# Patient Record
Sex: Female | Born: 1950 | Race: White | Hispanic: No | Marital: Married | Smoking: Never smoker
Health system: Southern US, Community
[De-identification: ages and names within clinical notes are randomized; demographics above are authoritative.]

## PROBLEM LIST (undated history)

## (undated) DIAGNOSIS — E119 Type 2 diabetes mellitus without complications: Secondary | ICD-10-CM

## (undated) DIAGNOSIS — K219 Gastro-esophageal reflux disease without esophagitis: Secondary | ICD-10-CM

## (undated) DIAGNOSIS — K311 Adult hypertrophic pyloric stenosis: Secondary | ICD-10-CM

## (undated) DIAGNOSIS — E611 Iron deficiency: Secondary | ICD-10-CM

## (undated) DIAGNOSIS — I10 Essential (primary) hypertension: Secondary | ICD-10-CM

## (undated) DIAGNOSIS — E7800 Pure hypercholesterolemia, unspecified: Secondary | ICD-10-CM

## (undated) DIAGNOSIS — E559 Vitamin D deficiency, unspecified: Secondary | ICD-10-CM

## (undated) DIAGNOSIS — E781 Pure hyperglyceridemia: Secondary | ICD-10-CM

## (undated) HISTORY — DX: Essential (primary) hypertension: I10

## (undated) HISTORY — DX: Adult hypertrophic pyloric stenosis: K31.1

## (undated) HISTORY — DX: Iron deficiency: E61.1

## (undated) HISTORY — DX: Type 2 diabetes mellitus without complications: E11.9

## (undated) HISTORY — DX: Vitamin D deficiency, unspecified: E55.9

## (undated) HISTORY — DX: Pure hypercholesterolemia, unspecified: E78.00

## (undated) HISTORY — DX: Pure hyperglyceridemia: E78.1

## (undated) HISTORY — DX: Gastro-esophageal reflux disease without esophagitis: K21.9

## (undated) HISTORY — DX: Hypercalcemia: E83.52

---

## 2016-05-14 ENCOUNTER — Encounter (HOSPITAL_BASED_OUTPATIENT_CLINIC_OR_DEPARTMENT_OTHER): Payer: Self-pay | Admitting: *Deleted

## 2016-05-14 ENCOUNTER — Emergency Department (HOSPITAL_BASED_OUTPATIENT_CLINIC_OR_DEPARTMENT_OTHER)
Admission: EM | Admit: 2016-05-14 | Discharge: 2016-05-14 | Disposition: A | Discharge status: Home / Self Care | Payer: Medicare Other | Attending: Emergency Medicine | Admitting: Emergency Medicine

## 2016-05-14 DIAGNOSIS — Z23 Encounter for immunization: Secondary | ICD-10-CM | POA: Diagnosis not present

## 2016-05-14 DIAGNOSIS — W1809XA Striking against other object with subsequent fall, initial encounter: Secondary | ICD-10-CM | POA: Diagnosis not present

## 2016-05-14 DIAGNOSIS — I159 Secondary hypertension, unspecified: Secondary | ICD-10-CM | POA: Diagnosis not present

## 2016-05-14 DIAGNOSIS — S0181XA Laceration without foreign body of other part of head, initial encounter: Secondary | ICD-10-CM | POA: Diagnosis not present

## 2016-05-14 DIAGNOSIS — W19XXXA Unspecified fall, initial encounter: Secondary | ICD-10-CM

## 2016-05-14 DIAGNOSIS — Y999 Unspecified external cause status: Secondary | ICD-10-CM | POA: Diagnosis not present

## 2016-05-14 DIAGNOSIS — Y9389 Activity, other specified: Secondary | ICD-10-CM | POA: Insufficient documentation

## 2016-05-14 DIAGNOSIS — Y92481 Parking lot as the place of occurrence of the external cause: Secondary | ICD-10-CM | POA: Diagnosis not present

## 2016-05-14 DIAGNOSIS — S0993XA Unspecified injury of face, initial encounter: Secondary | ICD-10-CM | POA: Diagnosis present

## 2016-05-14 MED ORDER — TETANUS-DIPHTH-ACELL PERTUSSIS 5-2.5-18.5 LF-MCG/0.5 IM SUSP
0.5000 mL | Freq: Once | INTRAMUSCULAR | Status: AC
  Administered 2016-05-14: 0.5 mL via INTRAMUSCULAR
  Filled 2016-05-14: qty 0.5

## 2016-05-14 MED ORDER — LIDOCAINE-EPINEPHRINE-TETRACAINE (LET) SOLUTION
3.0000 mL | Freq: Once | NASAL | Status: AC
  Administered 2016-05-14: 3 mL via TOPICAL
  Filled 2016-05-14: qty 3

## 2016-05-14 NOTE — ED Notes (Signed)
Patient is refusing to be placed on the monitor. Reports that she did not pass out.

## 2016-05-14 NOTE — ED Triage Notes (Signed)
She fell in the parking lot and pulled the shopping cart over on top of her. Laceration to the right side of her forehead. She does not know if she tripped and passed out.

## 2016-05-14 NOTE — ED Notes (Addendum)
Pt refused lab draws, states she wants to follow up with her MD.

## 2016-05-14 NOTE — ED Provider Notes (Signed)
[facility] Note   CSN: [id number] Arrival date & time: 05/14/16  1726  By signing my name below, I, Emmanuella Mensah, attest that this documentation has been prepared under the direction and in the presence of Kiel Cockerell, PA-C. Electronically Signed: Angelene GiovanniEmmanuella Mensah, ED Scribe. 05/14/16. 6:23 PM.   History   Chief Complaint Chief Complaint  Patient presents with  . Fall  . Laceration    HPI Comments: Theresa Hunter is a 65 y.o. female who presents to the Emergency Department complaining of a laceration to her right lateral forehead s/p fall that occurred PTA. Bleeding is currently controlled by gauze applied at triage. Pt reports associated left knee pain with bruising. She explains that she is unsure of the exact mechanism of the fall but remembers grabbing the cart as she fell causing it to strike her in her forehead and landing on outstretched her left hand and her left knee. She adds that maybe her foot was caught on the shopping cart, causing her to fall but "it happened so fast". She denies any LOC. No alleviating factors noted. Pt has not tried any medications PTA. She has NKDA. She is unsure of her last tetanus vaccine. She denies any anti coagulant use or a hx of hypertension. She also denies any fever, chills, nausea, vomiting,  dizziness, lightheadedness, chest pain, SOB, or any other symptoms.   The history is provided by the patient. No language interpreter was used.    History reviewed. No pertinent past medical history.  There are no active problems to display for this patient.   Past Surgical History:  Procedure Laterality Date  . CESAREAN SECTION      OB History    No data available       Home Medications    Prior to Admission medications   Not on File    Family History No family history on file.  Social History Social History  Substance Use Topics  . Smoking status: Never Smoker  . Smokeless tobacco: Never Used    . Alcohol use No     Allergies   Patient has no known allergies.   Review of Systems Review of Systems  Constitutional: Negative for chills and fever.  Respiratory: Negative for shortness of breath.   Cardiovascular: Negative for chest pain.  Gastrointestinal: Negative for nausea and vomiting.  Musculoskeletal: Positive for arthralgias.  Skin: Positive for wound.  Neurological: Negative for dizziness and light-headedness.     Physical Exam Updated Vital Signs BP (!) 255/125   Pulse 97   Temp 98.2 F (36.8 C) (Oral)   Resp 20   Ht 5\' 7"  (1.702 m)   Wt 170 lb (77.1 kg)   SpO2 99%   BMI 26.63 kg/m   Physical Exam  Constitutional: She is oriented to person, place, and time. She appears well-developed and well-nourished. No distress.  HENT:  Head: Normocephalic.  2 cm laceration to the right upper forehead and hairline. Hemostatic.  Eyes: Conjunctivae and EOM are normal. Pupils are equal, round, and reactive to light.  Neck: Normal range of motion. Neck supple. No tracheal deviation present.  No midline tenderness  Cardiovascular: Normal rate, regular rhythm and normal heart sounds.  Exam reveals no gallop and no friction rub.   No murmur heard. Pulmonary/Chest: Effort normal and breath sounds normal. No respiratory distress. She has no wheezes. She has no rales.  Musculoskeletal: Normal range of motion.  Full range of motion of all extremities  Neurological: She  is alert and oriented to person, place, and time.  5/5 and equal upper and lower extremity strength bilaterally. Equal grip strength bilaterally. Normal finger to nose and heel to shin. No pronator drift. Patellar reflexes 2+   Skin: Skin is warm and dry. Capillary refill takes less than 2 seconds.  Psychiatric: She has a normal mood and affect. Her behavior is normal.  Nursing note and vitals reviewed.    ED Treatments / Results  DIAGNOSTIC STUDIES: Oxygen Saturation is 99% on RA, normal by my  interpretation.    COORDINATION OF CARE: 6:21 PM- Pt advised of plan for treatment and pt agrees. Pt will receive laceration repair.    Labs (all labs ordered are listed, but only abnormal results are displayed) Labs Reviewed - No data to display  EKG  EKG Interpretation  Date/Time:  Thursday May 14 2016 17:41:34 EST Ventricular Rate:  88 PR Interval:  152 QRS Duration: 80 QT Interval:  384 QTC Calculation: 464 R Axis:   46 Text Interpretation:  Normal sinus rhythm Nonspecific ST and T wave abnormality Abnormal ECG No old tracing to compare Confirmed by [facility] MD, PEDRO ([id number]) on 05/14/2016 6:07:14 PM       Radiology No results found.  Procedures .Marland KitchenLaceration Repair Date/Time: 05/14/2016 6:21 PM Performed by: Jaynie Crumble Authorized by: Jaynie Crumble   Consent:    Consent obtained:  Verbal   Consent given by:  Patient   Risks discussed:  Pain Anesthesia (see MAR for exact dosages):    Anesthesia method:  None Laceration details:    Location:  Face   Face location:  Forehead   Length (cm):  2 Repair type:    Repair type:  Simple Exploration:    Hemostasis achieved with:  LET   Wound exploration: entire depth of wound probed and visualized     Contaminated: no   Treatment:    Area cleansed with:  Saline   Amount of cleaning:  Standard   Irrigation method:  Syringe Skin repair:    Repair method:  Staples   Number of staples:  1 Approximation:    Approximation:  Close   Vermilion border: well-aligned   Post-procedure details:    Dressing:  Open (no dressing)   Patient tolerance of procedure:  Tolerated well, no immediate complications     (including critical care time)  Medications Ordered in ED Medications - No data to display   Initial Impression / Assessment and Plan / ED Course  Jaynie Crumble, PA-C has reviewed the triage vital signs and the nursing notes.  Pertinent labs & imaging results that were available during my  care of the patient were reviewed by me and considered in my medical decision making (see chart for details).  Clinical Course   Patient in emergency department after a fall. Patient was in the parking lot, trying to load her groceries in the car when she fell. Initially it was unclear why patient fell. She told the triage nurse that she was not sure if she fainted. She told me that she is positive that she did not lose consciousness and was not feeling dizzy prior to the fall. She denies walking or tripping, states she was standing by the car and was trying to push a button on her key chain to open the trunk. She states that she was falling she grabbed onto the cart and the cart hit her in the forehead. She denies any headache, no dizziness, chest pain, no palpitations. She sustained a  laceration to the forehead. Upon arrival patient's blood pressure is 255/125. Exam is unremarkable other than laceration. I will repair laceration with a staple. Recheck blood pressure, it is 215/110. Patient states she has "white coat syndrome." She states she checks her blood pressure occasionally, last checked 2 months ago and it was 1:30 systolic. I advised patient that I would like to get some blood work, however she refused.     7:16 PM Patient again explained that her blood pressure is very high, and she voices understanding. Again refusing any further tests or blood work. She is alert and oriented, she is in no acute distress, she is fully capable of making her own decisions, and understanding of the potential consequences of not doing any further evaluation and emergency department. She will be discharged AGAINST MEDICAL ADVICE. I did discuss with her extensively return precautions and she voiced understanding.  Vitals:   05/14/16 1736 05/14/16 1738 05/14/16 1828  BP:  (!) 255/125 (!) 215/110  Pulse:  97 91  Resp:  20 20  Temp:  98.2 F (36.8 C)   TempSrc:  Oral   SpO2:  99% 100%  Weight: 77.1 kg      Height: 5\' 7"  (1.702 m)       Final Clinical Impressions(s) / ED Diagnoses   Final diagnoses:  Fall, initial encounter  Facial laceration, initial encounter  Secondary hypertension    New Prescriptions New Prescriptions   No medications on file    I personally performed the services described in this documentation, which was scribed in my presence. The recorded information has been reviewed and is accurate.    Jaynie Crumbleatyana Regla Fitzgibbon, PA-C 05/14/16 1918    Nira ConnPedro Eduardo Cardama, MD 05/15/16 [phone]

## 2016-05-14 NOTE — Discharge Instructions (Signed)
Bacitracin to the cut twice a day. Watch for signs of infection. Staple removal in 7-10 days.   YOUR BLOOD PRESSURE TODAY IS VERY HIGH. You refused any tests for further evaluation. Pleas make sure to follow up with your doctor for recheck as soon as able. Return if any worsening symptoms.

## 2016-05-14 NOTE — ED Notes (Signed)
Patient is complimentary, but reports that she wants to go home

## 2018-07-19 ENCOUNTER — Encounter: Payer: Self-pay | Admitting: Internal Medicine

## 2018-08-08 ENCOUNTER — Ambulatory Visit: Payer: Medicare Other | Admitting: Internal Medicine

## 2018-09-16 ENCOUNTER — Ambulatory Visit: Payer: Medicare Other | Admitting: Internal Medicine

## 2020-12-15 ENCOUNTER — Emergency Department (HOSPITAL_COMMUNITY): Payer: Medicare HMO

## 2020-12-15 ENCOUNTER — Other Ambulatory Visit: Payer: Self-pay

## 2020-12-15 ENCOUNTER — Inpatient Hospital Stay (HOSPITAL_COMMUNITY)
Admission: EM | Admit: 2020-12-15 | Discharge: 2021-01-13 | DRG: 957 | Disposition: E | Payer: Medicare HMO | Attending: General Surgery | Admitting: General Surgery

## 2020-12-15 DIAGNOSIS — T1490XA Injury, unspecified, initial encounter: Secondary | ICD-10-CM

## 2020-12-15 DIAGNOSIS — L8990 Pressure ulcer of unspecified site, unspecified stage: Secondary | ICD-10-CM | POA: Insufficient documentation

## 2020-12-15 DIAGNOSIS — R58 Hemorrhage, not elsewhere classified: Secondary | ICD-10-CM

## 2020-12-15 DIAGNOSIS — I6340 Cerebral infarction due to embolism of unspecified cerebral artery: Secondary | ICD-10-CM | POA: Insufficient documentation

## 2020-12-15 DIAGNOSIS — T148XXA Other injury of unspecified body region, initial encounter: Secondary | ICD-10-CM

## 2020-12-15 DIAGNOSIS — R109 Unspecified abdominal pain: Secondary | ICD-10-CM

## 2020-12-15 DIAGNOSIS — K567 Ileus, unspecified: Secondary | ICD-10-CM

## 2020-12-15 DIAGNOSIS — Z419 Encounter for procedure for purposes other than remedying health state, unspecified: Secondary | ICD-10-CM

## 2020-12-15 DIAGNOSIS — Z0189 Encounter for other specified special examinations: Secondary | ICD-10-CM

## 2020-12-15 DIAGNOSIS — Z452 Encounter for adjustment and management of vascular access device: Secondary | ICD-10-CM

## 2020-12-15 DIAGNOSIS — D696 Thrombocytopenia, unspecified: Secondary | ICD-10-CM

## 2020-12-15 DIAGNOSIS — S32001A Stable burst fracture of unspecified lumbar vertebra, initial encounter for closed fracture: Principal | ICD-10-CM

## 2020-12-15 DIAGNOSIS — J9690 Respiratory failure, unspecified, unspecified whether with hypoxia or hypercapnia: Secondary | ICD-10-CM

## 2020-12-15 DIAGNOSIS — Z4659 Encounter for fitting and adjustment of other gastrointestinal appliance and device: Secondary | ICD-10-CM

## 2020-12-15 DIAGNOSIS — I639 Cerebral infarction, unspecified: Secondary | ICD-10-CM | POA: Diagnosis not present

## 2020-12-15 DIAGNOSIS — G9341 Metabolic encephalopathy: Secondary | ICD-10-CM | POA: Diagnosis present

## 2020-12-15 DIAGNOSIS — R601 Generalized edema: Secondary | ICD-10-CM | POA: Diagnosis not present

## 2020-12-15 DIAGNOSIS — R402362 Coma scale, best motor response, obeys commands, at arrival to emergency department: Secondary | ICD-10-CM | POA: Diagnosis present

## 2020-12-15 DIAGNOSIS — Y92410 Unspecified street and highway as the place of occurrence of the external cause: Secondary | ICD-10-CM

## 2020-12-15 DIAGNOSIS — N141 Nephropathy induced by other drugs, medicaments and biological substances: Secondary | ICD-10-CM | POA: Diagnosis not present

## 2020-12-15 DIAGNOSIS — I160 Hypertensive urgency: Secondary | ICD-10-CM | POA: Diagnosis present

## 2020-12-15 DIAGNOSIS — Z20822 Contact with and (suspected) exposure to covid-19: Secondary | ICD-10-CM | POA: Diagnosis present

## 2020-12-15 DIAGNOSIS — D6959 Other secondary thrombocytopenia: Secondary | ICD-10-CM | POA: Diagnosis present

## 2020-12-15 DIAGNOSIS — D62 Acute posthemorrhagic anemia: Secondary | ICD-10-CM | POA: Diagnosis present

## 2020-12-15 DIAGNOSIS — E1165 Type 2 diabetes mellitus with hyperglycemia: Secondary | ICD-10-CM | POA: Diagnosis present

## 2020-12-15 DIAGNOSIS — S2231XA Fracture of one rib, right side, initial encounter for closed fracture: Secondary | ICD-10-CM | POA: Diagnosis present

## 2020-12-15 DIAGNOSIS — S36892A Contusion of other intra-abdominal organs, initial encounter: Secondary | ICD-10-CM | POA: Diagnosis not present

## 2020-12-15 DIAGNOSIS — J9600 Acute respiratory failure, unspecified whether with hypoxia or hypercapnia: Secondary | ICD-10-CM | POA: Diagnosis not present

## 2020-12-15 DIAGNOSIS — S52501A Unspecified fracture of the lower end of right radius, initial encounter for closed fracture: Secondary | ICD-10-CM | POA: Diagnosis present

## 2020-12-15 DIAGNOSIS — R402142 Coma scale, eyes open, spontaneous, at arrival to emergency department: Secondary | ICD-10-CM | POA: Diagnosis present

## 2020-12-15 DIAGNOSIS — E669 Obesity, unspecified: Secondary | ICD-10-CM | POA: Diagnosis present

## 2020-12-15 DIAGNOSIS — S32059A Unspecified fracture of fifth lumbar vertebra, initial encounter for closed fracture: Secondary | ICD-10-CM | POA: Diagnosis present

## 2020-12-15 DIAGNOSIS — J9622 Acute and chronic respiratory failure with hypercapnia: Secondary | ICD-10-CM | POA: Diagnosis not present

## 2020-12-15 DIAGNOSIS — S52611A Displaced fracture of right ulna styloid process, initial encounter for closed fracture: Secondary | ICD-10-CM | POA: Diagnosis present

## 2020-12-15 DIAGNOSIS — R6521 Severe sepsis with septic shock: Secondary | ICD-10-CM | POA: Diagnosis not present

## 2020-12-15 DIAGNOSIS — N170 Acute kidney failure with tubular necrosis: Secondary | ICD-10-CM | POA: Diagnosis not present

## 2020-12-15 DIAGNOSIS — Z6834 Body mass index (BMI) 34.0-34.9, adult: Secondary | ICD-10-CM | POA: Diagnosis not present

## 2020-12-15 DIAGNOSIS — K658 Other peritonitis: Secondary | ICD-10-CM | POA: Diagnosis not present

## 2020-12-15 DIAGNOSIS — T508X5A Adverse effect of diagnostic agents, initial encounter: Secondary | ICD-10-CM | POA: Diagnosis not present

## 2020-12-15 DIAGNOSIS — E876 Hypokalemia: Secondary | ICD-10-CM | POA: Diagnosis not present

## 2020-12-15 DIAGNOSIS — Z66 Do not resuscitate: Secondary | ICD-10-CM | POA: Diagnosis not present

## 2020-12-15 DIAGNOSIS — J9601 Acute respiratory failure with hypoxia: Secondary | ICD-10-CM | POA: Diagnosis present

## 2020-12-15 DIAGNOSIS — Z515 Encounter for palliative care: Secondary | ICD-10-CM

## 2020-12-15 DIAGNOSIS — S065X9A Traumatic subdural hemorrhage with loss of consciousness of unspecified duration, initial encounter: Principal | ICD-10-CM | POA: Diagnosis present

## 2020-12-15 DIAGNOSIS — K55069 Acute infarction of intestine, part and extent unspecified: Secondary | ICD-10-CM | POA: Diagnosis not present

## 2020-12-15 DIAGNOSIS — E7800 Pure hypercholesterolemia, unspecified: Secondary | ICD-10-CM | POA: Diagnosis present

## 2020-12-15 DIAGNOSIS — R339 Retention of urine, unspecified: Secondary | ICD-10-CM | POA: Diagnosis present

## 2020-12-15 DIAGNOSIS — A419 Sepsis, unspecified organism: Secondary | ICD-10-CM | POA: Diagnosis not present

## 2020-12-15 DIAGNOSIS — E8770 Fluid overload, unspecified: Secondary | ICD-10-CM | POA: Diagnosis present

## 2020-12-15 DIAGNOSIS — E872 Acidosis: Secondary | ICD-10-CM | POA: Diagnosis present

## 2020-12-15 DIAGNOSIS — Z7189 Other specified counseling: Secondary | ICD-10-CM | POA: Diagnosis not present

## 2020-12-15 DIAGNOSIS — N179 Acute kidney failure, unspecified: Secondary | ICD-10-CM | POA: Diagnosis not present

## 2020-12-15 DIAGNOSIS — R402252 Coma scale, best verbal response, oriented, at arrival to emergency department: Secondary | ICD-10-CM | POA: Diagnosis present

## 2020-12-15 DIAGNOSIS — I10 Essential (primary) hypertension: Secondary | ICD-10-CM | POA: Diagnosis present

## 2020-12-15 DIAGNOSIS — S069X9S Unspecified intracranial injury with loss of consciousness of unspecified duration, sequela: Secondary | ICD-10-CM | POA: Diagnosis not present

## 2020-12-15 DIAGNOSIS — R412 Retrograde amnesia: Secondary | ICD-10-CM | POA: Diagnosis present

## 2020-12-15 DIAGNOSIS — R4182 Altered mental status, unspecified: Secondary | ICD-10-CM | POA: Diagnosis not present

## 2020-12-15 DIAGNOSIS — I6389 Other cerebral infarction: Secondary | ICD-10-CM | POA: Diagnosis not present

## 2020-12-15 DIAGNOSIS — F0781 Postconcussional syndrome: Secondary | ICD-10-CM | POA: Diagnosis not present

## 2020-12-15 LAB — COMPREHENSIVE METABOLIC PANEL
ALT: 45 U/L — ABNORMAL HIGH (ref 0–44)
AST: 67 U/L — ABNORMAL HIGH (ref 15–41)
Albumin: 3.7 g/dL (ref 3.5–5.0)
Alkaline Phosphatase: 59 U/L (ref 38–126)
Anion gap: 14 (ref 5–15)
BUN: 12 mg/dL (ref 8–23)
CO2: 22 mmol/L (ref 22–32)
Calcium: 9.5 mg/dL (ref 8.9–10.3)
Chloride: 92 mmol/L — ABNORMAL LOW (ref 98–111)
Creatinine, Ser: 1.38 mg/dL — ABNORMAL HIGH (ref 0.44–1.00)
GFR, Estimated: 41 mL/min — ABNORMAL LOW (ref >60)
Glucose, Bld: 303 mg/dL — ABNORMAL HIGH (ref 70–99)
Potassium: 4.1 mmol/L (ref 3.5–5.1)
Sodium: 128 mmol/L — ABNORMAL LOW (ref 135–145)
Total Bilirubin: 1.3 mg/dL — ABNORMAL HIGH (ref 0.3–1.2)
Total Protein: 6.8 g/dL (ref 6.5–8.1)

## 2020-12-15 LAB — PROTIME-INR
INR: 1 (ref 0.8–1.2)
Prothrombin Time: 13.1 seconds (ref 11.4–15.2)

## 2020-12-15 LAB — CBC
HCT: 34.1 % — ABNORMAL LOW (ref 36.0–46.0)
HCT: 30.5 % — ABNORMAL LOW (ref 36.0–46.0)
Hemoglobin: 12 g/dL (ref 12.0–15.0)
Hemoglobin: 10.8 g/dL — ABNORMAL LOW (ref 12.0–15.0)
MCH: 32.2 pg (ref 26.0–34.0)
MCH: 32.5 pg (ref 26.0–34.0)
MCHC: 35.2 g/dL (ref 30.0–36.0)
MCHC: 35.4 g/dL (ref 30.0–36.0)
MCV: 91.4 fL (ref 80.0–100.0)
MCV: 91.9 fL (ref 80.0–100.0)
Platelets: 36 10*3/uL — ABNORMAL LOW (ref 150–400)
Platelets: 279 10*3/uL (ref 150–400)
RBC: 3.73 MIL/uL — ABNORMAL LOW (ref 3.87–5.11)
RBC: 3.32 MIL/uL — ABNORMAL LOW (ref 3.87–5.11)
RDW: 13.1 % (ref 11.5–15.5)
RDW: 13.1 % (ref 11.5–15.5)
WBC: 15.9 10*3/uL — ABNORMAL HIGH (ref 4.0–10.5)
WBC: 15 10*3/uL — ABNORMAL HIGH (ref 4.0–10.5)
nRBC: 0 % (ref 0.0–0.2)
nRBC: 0 % (ref 0.0–0.2)

## 2020-12-15 LAB — I-STAT CHEM 8, ED
BUN: 14 mg/dL (ref 8–23)
Calcium, Ion: 1.09 mmol/L — ABNORMAL LOW (ref 1.15–1.40)
Chloride: 94 mmol/L — ABNORMAL LOW (ref 98–111)
Creatinine, Ser: 1.2 mg/dL — ABNORMAL HIGH (ref 0.44–1.00)
Glucose, Bld: 307 mg/dL — ABNORMAL HIGH (ref 70–99)
HCT: 33 % — ABNORMAL LOW (ref 36.0–46.0)
Hemoglobin: 11.2 g/dL — ABNORMAL LOW (ref 12.0–15.0)
Potassium: 4.1 mmol/L (ref 3.5–5.1)
Sodium: 129 mmol/L — ABNORMAL LOW (ref 135–145)
TCO2: 24 mmol/L (ref 22–32)

## 2020-12-15 LAB — CBG MONITORING, ED
Glucose-Capillary: 279 mg/dL — ABNORMAL HIGH (ref 70–99)
Glucose-Capillary: 368 mg/dL — ABNORMAL HIGH (ref 70–99)

## 2020-12-15 LAB — LACTIC ACID, PLASMA
Lactic Acid, Venous: 4.3 mmol/L (ref 0.5–1.9)
Lactic Acid, Venous: 5.4 mmol/L (ref 0.5–1.9)

## 2020-12-15 LAB — SAMPLE TO BLOOD BANK

## 2020-12-15 LAB — ETHANOL: Alcohol, Ethyl (B): <10 mg/dL (ref <10)

## 2020-12-15 LAB — RESP PANEL BY RT-PCR (FLU A&B, COVID) ARPGX2
Influenza A by PCR: NEGATIVE
Influenza B by PCR: NEGATIVE
SARS Coronavirus 2 by RT PCR: NEGATIVE

## 2020-12-15 MED ORDER — DEXTROSE-NACL 5-0.9 % IV SOLN: INTRAVENOUS | Status: DC

## 2020-12-15 MED ORDER — LORAZEPAM 0.5 MG PO TABS
0.5000 mg | ORAL_TABLET | Freq: Every evening | ORAL | Status: DC | PRN
  Administered 2020-12-16 – 2020-12-28 (×4): 0.5 mg via ORAL
  Filled 2020-12-15 (×4): qty 1

## 2020-12-15 MED ORDER — FENTANYL CITRATE (PF) 100 MCG/2ML IJ SOLN
50.0000 ug | Freq: Once | INTRAMUSCULAR | Status: AC
  Administered 2020-12-15: 50 ug via INTRAVENOUS
  Filled 2020-12-15: qty 2

## 2020-12-15 MED ORDER — HYDROMORPHONE HCL 1 MG/ML IJ SOLN
1.0000 mg | INTRAMUSCULAR | Status: DC | PRN
  Administered 2020-12-15 – 2020-12-16 (×5): 1 mg via INTRAVENOUS
  Filled 2020-12-15 (×5): qty 1

## 2020-12-15 MED ORDER — ONDANSETRON HCL 4 MG/2ML IJ SOLN
4.0000 mg | Freq: Four times a day (QID) | INTRAMUSCULAR | Status: DC | PRN
  Administered 2020-12-15 – 2020-12-16 (×3): 4 mg via INTRAVENOUS
  Filled 2020-12-15 (×4): qty 2

## 2020-12-15 MED ORDER — ONDANSETRON HCL 4 MG/2ML IJ SOLN
4.0000 mg | Freq: Once | INTRAMUSCULAR | Status: AC
  Administered 2020-12-15: 4 mg via INTRAVENOUS
  Filled 2020-12-15: qty 2

## 2020-12-15 MED ORDER — FENTANYL CITRATE (PF) 100 MCG/2ML IJ SOLN
50.0000 ug | Freq: Once | INTRAMUSCULAR | Status: AC
Start: 2020-12-15 — End: 2020-12-15
  Administered 2020-12-15: 50 ug via INTRAVENOUS
  Filled 2020-12-15: qty 2

## 2020-12-15 MED ORDER — LACTATED RINGERS IV BOLUS
1000.0000 mL | Freq: Once | INTRAVENOUS | Status: AC
  Administered 2020-12-15: 1000 mL via INTRAVENOUS

## 2020-12-15 MED ORDER — ENOXAPARIN SODIUM 30 MG/0.3ML IJ SOSY
30.0000 mg | PREFILLED_SYRINGE | Freq: Two times a day (BID) | INTRAMUSCULAR | Status: DC
  Filled 2020-12-15: qty 0.3

## 2020-12-15 MED ORDER — LIDOCAINE-EPINEPHRINE 1 %-1:100000 IJ SOLN
20.0000 mL | Freq: Once | INTRAMUSCULAR | Status: AC
  Administered 2020-12-15: 20 mL via INTRADERMAL
  Filled 2020-12-15: qty 1

## 2020-12-15 MED ORDER — ONDANSETRON 4 MG PO TBDP: 4.0000 mg | ORAL_TABLET | Freq: Four times a day (QID) | ORAL | Status: DC | PRN

## 2020-12-15 MED ORDER — IOHEXOL 350 MG/ML SOLN
100.0000 mL | Freq: Once | INTRAVENOUS | Status: AC | PRN
  Administered 2020-12-15: 100 mL via INTRAVENOUS

## 2020-12-15 NOTE — TOC CAGE-AID Note (Signed)
Transition of Care [facility]) - CAGE-AID Screening   Patient Details  Name: Theresa Hunter MRN: [id number] Date of Birth: June 17, 1950  [facility] of [facility]) CM/SW Contact:    Katha Hamming, RN Phone Number:[phone] 12/15/2020, 11:50 PM   Clinical Narrative:  Patient alert but confused, unable to provide accurate information at this time.  CAGE-AID Screening: Substance Abuse Screening unable to be completed due to: : Patient unable to participate

## 2020-12-15 NOTE — ED Notes (Signed)
No needs from pt at this time. Husband given [place] sandwich  and coffee
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
## 2020-12-15 NOTE — Progress Notes (Signed)
Orthopedic Tech Progress Note Patient Details:  [name] 1950-12-29 [id number]  Shoulder immobilizer sling was dropped off following splint application per verbal request of Dr. Rhunette Croft when he was in the pt room.   Ortho Devices Type of Ortho Device: Sugartong splint, Shoulder immobilizer Ortho Device/Splint Location: RUE Ortho Device/Splint Interventions: Adjustment, Application, Ordered   Post Interventions Patient Tolerated: Well Instructions Provided: Care of device, Adjustment of device  Kristopher Delk Carmine Savoy 12/15/2020, 10:41 AM

## 2020-12-15 NOTE — ED Notes (Signed)
Patient transported to CT 
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
## 2020-12-15 NOTE — ED Notes (Signed)
RN aware of pt o2 levels nurse in room at this time

## 2020-12-15 NOTE — ED Notes (Signed)
TRN and MD Derrell Lolling made aware of change in orientation.

## 2020-12-15 NOTE — ED Notes (Signed)
Husband is now at bedside
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
## 2020-12-15 NOTE — ED Notes (Signed)
Pt has not urinated since arriving. Pt stated she is having some abdominal discomfort and feels like she is unable to pee. When assessing pt abdomen pt jumped in discomfort when touching lower abdomin region. I will reach out to provider.

## 2020-12-15 NOTE — ED Notes (Signed)
Pt continues to be confused. Repeating phrase "I just woke up" when pt has not been to sleep. Will continue to monitor pt neuro status.

## 2020-12-15 NOTE — ED Notes (Signed)
Pt SpO2 84% RA, placed on [place] 4 L.
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
## 2020-12-15 NOTE — Progress Notes (Addendum)
Patient ID: Theresa Hunter, female   DOB: 03-28-1951, 70 y.o.   MRN: [id number]  Consult received Trying to contact ER doc Will need Hand surgery consult - Dr. Aundria Rud today  As for CT scan findings L5 compression versus ?burst there is no evidence of significant retropulsion.  This will be treated non operatively Formal note to follow likely from Aundria Rud  I was able to review case with Dr. Aundria Rud who will assess injuries to wrist and likely comment on care for L5 injury

## 2020-12-15 NOTE — Progress Notes (Signed)
Orthopedic Tech Progress Note Patient Details:  Theresa Hunter 1950-08-24 [id number]  Called [employer] for LSO custom clamshell brace at 1305.  Patient ID: Theresa Hunter, female   DOB: 14-Dec-1950, 69 y.o.   MRN: [id number]  Docia Furl 12/15/2020, 1:07 PM
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
## 2020-12-16 ENCOUNTER — Inpatient Hospital Stay (HOSPITAL_COMMUNITY): Payer: Medicare HMO

## 2020-12-16 ENCOUNTER — Encounter (HOSPITAL_COMMUNITY): Payer: Self-pay

## 2020-12-16 LAB — URINALYSIS, ROUTINE W REFLEX MICROSCOPIC
Bacteria, UA: NONE SEEN
Bilirubin Urine: NEGATIVE
Glucose, UA: 150 mg/dL — AB
Ketones, ur: NEGATIVE mg/dL
Leukocytes,Ua: NEGATIVE
Nitrite: NEGATIVE
Protein, ur: 30 mg/dL — AB
Specific Gravity, Urine: >1.046 — ABNORMAL HIGH (ref 1.005–1.030)
pH: 6 (ref 5.0–8.0)

## 2020-12-16 LAB — BASIC METABOLIC PANEL
Anion gap: 11 (ref 5–15)
Anion gap: 15 (ref 5–15)
BUN: 27 mg/dL — ABNORMAL HIGH (ref 8–23)
BUN: 35 mg/dL — ABNORMAL HIGH (ref 8–23)
CO2: 22 mmol/L (ref 22–32)
CO2: 18 mmol/L — ABNORMAL LOW (ref 22–32)
Calcium: 8.7 mg/dL — ABNORMAL LOW (ref 8.9–10.3)
Calcium: 8.5 mg/dL — ABNORMAL LOW (ref 8.9–10.3)
Chloride: 96 mmol/L — ABNORMAL LOW (ref 98–111)
Chloride: 99 mmol/L (ref 98–111)
Creatinine, Ser: 2.92 mg/dL — ABNORMAL HIGH (ref 0.44–1.00)
Creatinine, Ser: 3.45 mg/dL — ABNORMAL HIGH (ref 0.44–1.00)
GFR, Estimated: 17 mL/min — ABNORMAL LOW (ref >60)
GFR, Estimated: 14 mL/min — ABNORMAL LOW (ref >60)
Glucose, Bld: 354 mg/dL — ABNORMAL HIGH (ref 70–99)
Glucose, Bld: 295 mg/dL — ABNORMAL HIGH (ref 70–99)
Potassium: 5 mmol/L (ref 3.5–5.1)
Potassium: 4.8 mmol/L (ref 3.5–5.1)
Sodium: 129 mmol/L — ABNORMAL LOW (ref 135–145)
Sodium: 132 mmol/L — ABNORMAL LOW (ref 135–145)

## 2020-12-16 LAB — CBC
HCT: 28.1 % — ABNORMAL LOW (ref 36.0–46.0)
Hemoglobin: 9.7 g/dL — ABNORMAL LOW (ref 12.0–15.0)
MCH: 32.4 pg (ref 26.0–34.0)
MCHC: 34.5 g/dL (ref 30.0–36.0)
MCV: 94 fL (ref 80.0–100.0)
Platelets: 227 10*3/uL (ref 150–400)
RBC: 2.99 MIL/uL — ABNORMAL LOW (ref 3.87–5.11)
RDW: 13.5 % (ref 11.5–15.5)
WBC: 14 10*3/uL — ABNORMAL HIGH (ref 4.0–10.5)
nRBC: 0 % (ref 0.0–0.2)

## 2020-12-16 LAB — CBG MONITORING, ED
Glucose-Capillary: 358 mg/dL — ABNORMAL HIGH (ref 70–99)
Glucose-Capillary: 318 mg/dL — ABNORMAL HIGH (ref 70–99)

## 2020-12-16 LAB — GLUCOSE, CAPILLARY
Glucose-Capillary: 303 mg/dL — ABNORMAL HIGH (ref 70–99)
Glucose-Capillary: 217 mg/dL — ABNORMAL HIGH (ref 70–99)
Glucose-Capillary: 214 mg/dL — ABNORMAL HIGH (ref 70–99)

## 2020-12-16 LAB — HEMOGLOBIN A1C
Hgb A1c MFr Bld: 7.9 % — ABNORMAL HIGH (ref 4.8–5.6)
Mean Plasma Glucose: 180.03 mg/dL

## 2020-12-16 LAB — LACTIC ACID, PLASMA: Lactic Acid, Venous: 3.6 mmol/L (ref 0.5–1.9)

## 2020-12-16 MED ORDER — IOHEXOL 9 MG/ML PO SOLN
500.0000 mL | ORAL | Status: AC
  Administered 2020-12-16: 150 mL via ORAL
  Administered 2020-12-16: 500 mL via ORAL

## 2020-12-16 MED ORDER — INSULIN ASPART 100 UNIT/ML IJ SOLN
0.0000 [IU] | INTRAMUSCULAR | Status: DC
  Administered 2020-12-16: 5 [IU] via SUBCUTANEOUS
  Administered 2020-12-16: 11 [IU] via SUBCUTANEOUS
  Administered 2020-12-16: 5 [IU] via SUBCUTANEOUS
  Administered 2020-12-17: 3 [IU] via SUBCUTANEOUS
  Administered 2020-12-17: 2 [IU] via SUBCUTANEOUS
  Administered 2020-12-17 – 2020-12-18 (×5): 3 [IU] via SUBCUTANEOUS
  Administered 2020-12-18 (×2): 2 [IU] via SUBCUTANEOUS
  Administered 2020-12-18: 3 [IU] via SUBCUTANEOUS
  Administered 2020-12-18 – 2020-12-19 (×4): 2 [IU] via SUBCUTANEOUS
  Administered 2020-12-19: 3 [IU] via SUBCUTANEOUS
  Administered 2020-12-19 – 2020-12-20 (×3): 2 [IU] via SUBCUTANEOUS
  Administered 2020-12-20 (×3): 3 [IU] via SUBCUTANEOUS
  Administered 2020-12-21 (×2): 2 [IU] via SUBCUTANEOUS
  Administered 2020-12-21: 3 [IU] via SUBCUTANEOUS
  Administered 2020-12-21 – 2020-12-25 (×4): 2 [IU] via SUBCUTANEOUS
  Administered 2020-12-25 (×2): 3 [IU] via SUBCUTANEOUS
  Administered 2020-12-25 (×2): 2 [IU] via SUBCUTANEOUS
  Administered 2020-12-25 – 2020-12-26 (×2): 3 [IU] via SUBCUTANEOUS
  Administered 2020-12-26: 5 [IU] via SUBCUTANEOUS
  Administered 2020-12-26: 3 [IU] via SUBCUTANEOUS
  Administered 2020-12-26: 2 [IU] via SUBCUTANEOUS
  Administered 2020-12-26 – 2020-12-27 (×3): 3 [IU] via SUBCUTANEOUS
  Administered 2020-12-27 (×4): 5 [IU] via SUBCUTANEOUS
  Administered 2020-12-28: 3 [IU] via SUBCUTANEOUS
  Administered 2020-12-28 (×2): 5 [IU] via SUBCUTANEOUS
  Administered 2020-12-28: 3 [IU] via SUBCUTANEOUS
  Administered 2020-12-28 (×3): 2 [IU] via SUBCUTANEOUS
  Administered 2020-12-29: 5 [IU] via SUBCUTANEOUS
  Administered 2020-12-29: 3 [IU] via SUBCUTANEOUS
  Administered 2020-12-29: 5 [IU] via SUBCUTANEOUS
  Administered 2020-12-29 (×2): 3 [IU] via SUBCUTANEOUS
  Administered 2020-12-30: 5 [IU] via SUBCUTANEOUS
  Administered 2020-12-30: 3 [IU] via SUBCUTANEOUS
  Administered 2020-12-30 (×2): 2 [IU] via SUBCUTANEOUS
  Administered 2020-12-30: 3 [IU] via SUBCUTANEOUS
  Administered 2020-12-30 – 2020-12-31 (×2): 5 [IU] via SUBCUTANEOUS
  Administered 2020-12-31: 4 [IU] via SUBCUTANEOUS
  Administered 2020-12-31: 2 [IU] via SUBCUTANEOUS
  Administered 2020-12-31 – 2021-01-01 (×3): 5 [IU] via SUBCUTANEOUS
  Administered 2021-01-01: 3 [IU] via SUBCUTANEOUS
  Administered 2021-01-01: 8 [IU] via SUBCUTANEOUS
  Administered 2021-01-01: 4 [IU] via SUBCUTANEOUS
  Administered 2021-01-01 (×2): 8 [IU] via SUBCUTANEOUS
  Administered 2021-01-02: 3 [IU] via SUBCUTANEOUS
  Administered 2021-01-02 (×2): 8 [IU] via SUBCUTANEOUS
  Administered 2021-01-02: 3 [IU] via SUBCUTANEOUS
  Administered 2021-01-03: 8 [IU] via SUBCUTANEOUS
  Administered 2021-01-03: 5 [IU] via SUBCUTANEOUS
  Administered 2021-01-03: 8 [IU] via SUBCUTANEOUS
  Administered 2021-01-03: 3 [IU] via SUBCUTANEOUS
  Administered 2021-01-03: 8 [IU] via SUBCUTANEOUS

## 2020-12-16 MED ORDER — SODIUM CHLORIDE 0.9 % IV SOLN: INTRAVENOUS | Status: DC

## 2020-12-16 MED ORDER — SODIUM CHLORIDE 0.9 % IV BOLUS
2000.0000 mL | Freq: Once | INTRAVENOUS | Status: AC
  Administered 2020-12-16: 2000 mL via INTRAVENOUS

## 2020-12-16 MED ORDER — PROMETHAZINE HCL 12.5 MG RE SUPP
12.5000 mg | Freq: Four times a day (QID) | RECTAL | Status: DC | PRN
  Filled 2020-12-16: qty 1

## 2020-12-16 MED ORDER — ONDANSETRON HCL 4 MG/2ML IJ SOLN: 4.0000 mg | INTRAMUSCULAR | Status: DC | PRN

## 2020-12-16 MED ORDER — ONDANSETRON 4 MG PO TBDP: 4.0000 mg | ORAL_TABLET | ORAL | Status: DC | PRN

## 2020-12-16 MED ORDER — HALOPERIDOL LACTATE 5 MG/ML IJ SOLN
2.0000 mg | Freq: Once | INTRAMUSCULAR | Status: DC
  Filled 2020-12-16: qty 1
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
## 2020-12-16 NOTE — Progress Notes (Addendum)
Inpatient Diabetes Program Recommendations  AACE/ADA: New Consensus Statement on Inpatient Glycemic Control (2015)  Target Ranges:  Prepandial:   less than 140 mg/dL      Peak postprandial:   less than 180 mg/dL (1-2 hours)      Critically ill patients:  140 - 180 mg/dL   Lab Results  Component Value Date   GLUCAP 318 (H) 12/16/2020   HGBA1C 7.9 (H) 12/16/2020    Review of Glycemic Control  Diabetes history: DM2 Outpatient Diabetes medications: metformin 500 mg BID Current orders for Inpatient glycemic control: None  HgbA1C - 7.9% CBGs in 300s. Pt is NPO  Inpatient Diabetes Program Recommendations:    Add Novolog 0-15 units Q4H Consider Lantus 6 units QHS  Follow glucose trends.  Thank you. Ailene Ards, RD, LDN, CDE Inpatient Diabetes Coordinator [phone]
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
## 2020-12-16 NOTE — ED Notes (Signed)
The pt repositioned  She had half her gown pulled down when I walked in the room, suddenly the pt began to c/o lower abd pain   And in a few minutes she vomited approx  200 ml of liquid light brown.  She had pulled down her nasal 02 cannul when I first came into the room  Her sats were in the high 80s  Nasal cannula replaced to 2liters of 02  sats increased to 93%.. we attempted to clean the pts  Sheet beneath her started c/o more nausea with pain in her lower abdomen and her lt hip especially with movement  She has not slept any since 0300a  Just lies and looks around the room  Alert and oriented x 4

## 2020-12-16 NOTE — Progress Notes (Signed)
2101 Pt pulled NG tube out of left nare. Pt stated that she did not know why she did it. Pt alert and oriented to self and place. Disoriented to time and situation. No notable nose trauma or drainage. MD paged and made aware   2107 MD gave verbal order of 2mg  IV Haldol. No new orders for NG tube reinsertion.   Will continue to monitor for any abd distention, pain, and n/v.
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
## 2020-12-17 ENCOUNTER — Encounter (HOSPITAL_COMMUNITY): Payer: Self-pay | Admitting: Certified Registered Nurse Anesthetist

## 2020-12-17 ENCOUNTER — Inpatient Hospital Stay (HOSPITAL_COMMUNITY): Payer: Medicare HMO

## 2020-12-17 LAB — BASIC METABOLIC PANEL
Anion gap: 13 (ref 5–15)
Anion gap: 12 (ref 5–15)
BUN: 49 mg/dL — ABNORMAL HIGH (ref 8–23)
BUN: 49 mg/dL — ABNORMAL HIGH (ref 8–23)
CO2: 21 mmol/L — ABNORMAL LOW (ref 22–32)
CO2: 20 mmol/L — ABNORMAL LOW (ref 22–32)
Calcium: 8.3 mg/dL — ABNORMAL LOW (ref 8.9–10.3)
Calcium: 8 mg/dL — ABNORMAL LOW (ref 8.9–10.3)
Chloride: 99 mmol/L (ref 98–111)
Chloride: 103 mmol/L (ref 98–111)
Creatinine, Ser: 3.76 mg/dL — ABNORMAL HIGH (ref 0.44–1.00)
Creatinine, Ser: 3.4 mg/dL — ABNORMAL HIGH (ref 0.44–1.00)
GFR, Estimated: 12 mL/min — ABNORMAL LOW (ref >60)
GFR, Estimated: 14 mL/min — ABNORMAL LOW (ref >60)
Glucose, Bld: 179 mg/dL — ABNORMAL HIGH (ref 70–99)
Glucose, Bld: 190 mg/dL — ABNORMAL HIGH (ref 70–99)
Potassium: 4.6 mmol/L (ref 3.5–5.1)
Potassium: 4.6 mmol/L (ref 3.5–5.1)
Sodium: 133 mmol/L — ABNORMAL LOW (ref 135–145)
Sodium: 135 mmol/L (ref 135–145)

## 2020-12-17 LAB — BLOOD GAS, ARTERIAL
Acid-base deficit: 5.6 mmol/L — ABNORMAL HIGH (ref 0.0–2.0)
Bicarbonate: 18.9 mmol/L — ABNORMAL LOW (ref 20.0–28.0)
Drawn by: 54887
FIO2: 21
O2 Saturation: 83.4 %
Patient temperature: 37
pCO2 arterial: 35 mmHg (ref 32.0–48.0)
pH, Arterial: 7.353 (ref 7.350–7.450)
pO2, Arterial: 50.6 mmHg — ABNORMAL LOW (ref 83.0–108.0)

## 2020-12-17 LAB — GLUCOSE, CAPILLARY
Glucose-Capillary: 131 mg/dL — ABNORMAL HIGH (ref 70–99)
Glucose-Capillary: 163 mg/dL — ABNORMAL HIGH (ref 70–99)
Glucose-Capillary: 165 mg/dL — ABNORMAL HIGH (ref 70–99)
Glucose-Capillary: 170 mg/dL — ABNORMAL HIGH (ref 70–99)
Glucose-Capillary: 176 mg/dL — ABNORMAL HIGH (ref 70–99)
Glucose-Capillary: 179 mg/dL — ABNORMAL HIGH (ref 70–99)
Glucose-Capillary: 187 mg/dL — ABNORMAL HIGH (ref 70–99)

## 2020-12-17 LAB — CBC
HCT: 23.6 % — ABNORMAL LOW (ref 36.0–46.0)
HCT: 23.6 % — ABNORMAL LOW (ref 36.0–46.0)
Hemoglobin: 8 g/dL — ABNORMAL LOW (ref 12.0–15.0)
Hemoglobin: 8.1 g/dL — ABNORMAL LOW (ref 12.0–15.0)
MCH: 31.7 pg (ref 26.0–34.0)
MCH: 32 pg (ref 26.0–34.0)
MCHC: 33.9 g/dL (ref 30.0–36.0)
MCHC: 34.3 g/dL (ref 30.0–36.0)
MCV: 93.7 fL (ref 80.0–100.0)
MCV: 93.3 fL (ref 80.0–100.0)
Platelets: 171 10*3/uL (ref 150–400)
Platelets: 168 10*3/uL (ref 150–400)
RBC: 2.52 MIL/uL — ABNORMAL LOW (ref 3.87–5.11)
RBC: 2.53 MIL/uL — ABNORMAL LOW (ref 3.87–5.11)
RDW: 13.7 % (ref 11.5–15.5)
RDW: 13.8 % (ref 11.5–15.5)
WBC: 10.6 10*3/uL — ABNORMAL HIGH (ref 4.0–10.5)
WBC: 8.2 10*3/uL (ref 4.0–10.5)
nRBC: 0 % (ref 0.0–0.2)
nRBC: 0 % (ref 0.0–0.2)

## 2020-12-17 LAB — URINALYSIS, ROUTINE W REFLEX MICROSCOPIC
Bilirubin Urine: NEGATIVE
Glucose, UA: 50 mg/dL — AB
Ketones, ur: NEGATIVE mg/dL
Nitrite: NEGATIVE
Protein, ur: 30 mg/dL — AB
Specific Gravity, Urine: 1.019 (ref 1.005–1.030)
pH: 5 (ref 5.0–8.0)

## 2020-12-17 LAB — CREATININE, URINE, RANDOM: Creatinine, Urine: 95.75 mg/dL

## 2020-12-17 LAB — SODIUM, URINE, RANDOM: Sodium, Ur: 22 mmol/L

## 2020-12-17 MED ORDER — HYDROMORPHONE HCL 1 MG/ML IJ SOLN
0.5000 mg | INTRAMUSCULAR | Status: DC | PRN
  Administered 2020-12-17: 0.5 mg via INTRAVENOUS
  Filled 2020-12-17: qty 0.5

## 2020-12-17 MED ORDER — ACETAMINOPHEN 500 MG PO TABS
1000.0000 mg | ORAL_TABLET | Freq: Four times a day (QID) | ORAL | Status: DC
  Administered 2020-12-17 (×2): 1000 mg via ORAL
  Filled 2020-12-17 (×2): qty 2

## 2020-12-17 MED ORDER — SODIUM CHLORIDE 0.9 % IV BOLUS
1000.0000 mL | Freq: Once | INTRAVENOUS | Status: AC
  Administered 2020-12-17: 1000 mL via INTRAVENOUS

## 2020-12-17 MED ORDER — CHLORHEXIDINE GLUCONATE CLOTH 2 % EX PADS
6.0000 | MEDICATED_PAD | Freq: Every day | CUTANEOUS | Status: DC
  Administered 2020-12-17 – 2021-01-03 (×19): 6 via TOPICAL

## 2020-12-17 MED ORDER — METHOCARBAMOL 500 MG PO TABS
1000.0000 mg | ORAL_TABLET | Freq: Three times a day (TID) | ORAL | Status: DC
  Administered 2020-12-17 (×2): 1000 mg via ORAL
  Filled 2020-12-17 (×2): qty 2

## 2020-12-17 MED ORDER — PROPOFOL 10 MG/ML IV BOLUS
INTRAVENOUS | Status: AC
  Filled 2020-12-17: qty 20

## 2020-12-17 MED ORDER — OXYCODONE HCL 5 MG/5ML PO SOLN
2.5000 mg | ORAL | Status: DC | PRN
  Administered 2020-12-18: 5 mg
  Filled 2020-12-17: qty 5

## 2020-12-17 MED ORDER — HEPARIN SODIUM (PORCINE) 5000 UNIT/ML IJ SOLN
5000.0000 [IU] | Freq: Three times a day (TID) | INTRAMUSCULAR | Status: DC
  Administered 2020-12-17 – 2021-01-03 (×46): 5000 [IU] via SUBCUTANEOUS
  Filled 2020-12-17 (×47): qty 1

## 2020-12-17 MED ORDER — FENTANYL CITRATE (PF) 250 MCG/5ML IJ SOLN
INTRAMUSCULAR | Status: AC
  Filled 2020-12-17: qty 5
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
## 2020-12-17 NOTE — Progress Notes (Signed)
Dr. Bedelia Person notified, in person, of hgb of 8.1 and that the CBC had resulted. Verbal order given for an NG tube placement. Dr. Bedelia Person aware that pt has ripped out all previous NG tubes. Primary RN notified that Dr. Bedelia Person would like an NG tube, primary RN aware that this TRN will come and help with placement of NG

## 2020-12-17 NOTE — Progress Notes (Signed)
Pt c/o unable to urinate using purewick. Pt assisted to [facility] x2 assist. Pt stated that she has the feeling but unable to go. Cold wash clothes applied to abd to assist with urinating. Pt bladder scan shows . MD paged and made aware.  New orders from MD to insert Foley catheter.   This RN inserted 20fr foley catheter with Byrd Hesselbach, RN along beside. Pt tolerated procedure well. Urine yellow and clear in drainage bag. Will continue to monitor.

## 2020-12-17 NOTE — Progress Notes (Signed)
This RN attempted to place NG tube x2 and was unable to. Primary RN attempted x1 and was able to place with positive auscultation sounds. X-ray placed for official verification.

## 2020-12-17 NOTE — Progress Notes (Signed)
ABG sample was collected and sent to Lab. Lab was called and notified.  
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
## 2020-12-18 LAB — GLUCOSE, CAPILLARY
Glucose-Capillary: 138 mg/dL — ABNORMAL HIGH (ref 70–99)
Glucose-Capillary: 146 mg/dL — ABNORMAL HIGH (ref 70–99)
Glucose-Capillary: 147 mg/dL — ABNORMAL HIGH (ref 70–99)
Glucose-Capillary: 148 mg/dL — ABNORMAL HIGH (ref 70–99)
Glucose-Capillary: 162 mg/dL — ABNORMAL HIGH (ref 70–99)
Glucose-Capillary: 164 mg/dL — ABNORMAL HIGH (ref 70–99)

## 2020-12-18 LAB — BASIC METABOLIC PANEL
Anion gap: 8 (ref 5–15)
BUN: 47 mg/dL — ABNORMAL HIGH (ref 8–23)
CO2: 19 mmol/L — ABNORMAL LOW (ref 22–32)
Calcium: 7.9 mg/dL — ABNORMAL LOW (ref 8.9–10.3)
Chloride: 110 mmol/L (ref 98–111)
Creatinine, Ser: 2.72 mg/dL — ABNORMAL HIGH (ref 0.44–1.00)
GFR, Estimated: 18 mL/min — ABNORMAL LOW (ref >60)
Glucose, Bld: 170 mg/dL — ABNORMAL HIGH (ref 70–99)
Potassium: 3.9 mmol/L (ref 3.5–5.1)
Sodium: 137 mmol/L (ref 135–145)

## 2020-12-18 LAB — CBC
HCT: 20.8 % — ABNORMAL LOW (ref 36.0–46.0)
Hemoglobin: 7 g/dL — ABNORMAL LOW (ref 12.0–15.0)
MCH: 31.7 pg (ref 26.0–34.0)
MCHC: 33.7 g/dL (ref 30.0–36.0)
MCV: 94.1 fL (ref 80.0–100.0)
Platelets: 168 10*3/uL (ref 150–400)
RBC: 2.21 MIL/uL — ABNORMAL LOW (ref 3.87–5.11)
RDW: 13.9 % (ref 11.5–15.5)
WBC: 6.6 10*3/uL (ref 4.0–10.5)
nRBC: 0 % (ref 0.0–0.2)

## 2020-12-18 MED ORDER — OXYCODONE HCL 5 MG PO TABS: 2.5000 mg | ORAL_TABLET | ORAL | Status: DC | PRN

## 2020-12-18 MED ORDER — ACETAMINOPHEN 160 MG/5ML PO SOLN
650.0000 mg | Freq: Four times a day (QID) | ORAL | Status: DC
  Administered 2020-12-18 (×4): 650 mg
  Filled 2020-12-18 (×4): qty 20.3

## 2020-12-18 MED ORDER — METHOCARBAMOL 500 MG PO TABS
1000.0000 mg | ORAL_TABLET | Freq: Three times a day (TID) | ORAL | Status: DC
  Administered 2020-12-18 – 2021-01-03 (×46): 1000 mg
  Filled 2020-12-18 (×46): qty 2

## 2020-12-18 MED ORDER — ACETAMINOPHEN 160 MG/5ML PO SOLN
650.0000 mg | Freq: Four times a day (QID) | ORAL | Status: DC
  Administered 2020-12-19: 650 mg via ORAL
  Filled 2020-12-18: qty 20.3

## 2020-12-18 NOTE — Progress Notes (Signed)
Pt pulled NGT out again, [employer] notify,we can leave it out for now. Will continue to monitor.

## 2020-12-18 NOTE — Progress Notes (Signed)
Writer placed 12Fr NGT to R Nare with (+) auscultation sounds. Placement confirmed by Abd X-ray. NGT to LIWS.   Call bell within reach and will continue to monitor.
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
## 2020-12-18 NOTE — Progress Notes (Signed)
Inpatient Rehab Admissions Coordinator Note:   Per therapy recommendations, pt was screened for CIR candidacy by Megan Salon, MS CCC-SLP. At this time, Pt is lethargic and unable to tolerate OOB. I do not believe Pt. Would be able to tolerate CIR at this time; however, she is likely to progress, so [facility] team will follow and monitor for progress and participation with therapies and place consult order when Pt. Appears to be an appropriate candidate.  Please contact me with questions.   Megan Salon, MS, CCC-SLP Rehab Admissions Coordinator  [phone] (celll) [phone] (office)
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
## 2020-12-19 ENCOUNTER — Encounter (HOSPITAL_COMMUNITY): Admission: EM | Disposition: E | Payer: Self-pay | Source: Home / Self Care

## 2020-12-19 ENCOUNTER — Inpatient Hospital Stay (HOSPITAL_COMMUNITY): Payer: Medicare HMO

## 2020-12-19 ENCOUNTER — Inpatient Hospital Stay (HOSPITAL_COMMUNITY): Payer: Medicare HMO | Admitting: Anesthesiology

## 2020-12-19 HISTORY — PX: BOWEL RESECTION: SHX1257

## 2020-12-19 HISTORY — PX: LAPAROTOMY: SHX154

## 2020-12-19 HISTORY — PX: APPLICATION OF WOUND VAC: SHX5189

## 2020-12-19 LAB — URINE CULTURE: Culture: NO GROWTH

## 2020-12-19 LAB — CBC
HCT: 22.6 % — ABNORMAL LOW (ref 36.0–46.0)
HCT: 28 % — ABNORMAL LOW (ref 36.0–46.0)
Hemoglobin: 7.5 g/dL — ABNORMAL LOW (ref 12.0–15.0)
Hemoglobin: 9.3 g/dL — ABNORMAL LOW (ref 12.0–15.0)
MCH: 31.5 pg (ref 26.0–34.0)
MCH: 30.2 pg (ref 26.0–34.0)
MCHC: 33.2 g/dL (ref 30.0–36.0)
MCHC: 33.2 g/dL (ref 30.0–36.0)
MCV: 95 fL (ref 80.0–100.0)
MCV: 90.9 fL (ref 80.0–100.0)
Platelets: 219 10*3/uL (ref 150–400)
Platelets: 173 10*3/uL (ref 150–400)
RBC: 2.38 MIL/uL — ABNORMAL LOW (ref 3.87–5.11)
RBC: 3.08 MIL/uL — ABNORMAL LOW (ref 3.87–5.11)
RDW: 14.2 % (ref 11.5–15.5)
RDW: 17.7 % — ABNORMAL HIGH (ref 11.5–15.5)
WBC: 6.4 10*3/uL (ref 4.0–10.5)
WBC: 2.3 10*3/uL — ABNORMAL LOW (ref 4.0–10.5)
nRBC: 0 % (ref 0.0–0.2)
nRBC: 1.3 % — ABNORMAL HIGH (ref 0.0–0.2)

## 2020-12-19 LAB — POCT I-STAT 7, (LYTES, BLD GAS, ICA,H+H)
Acid-base deficit: 9 mmol/L — ABNORMAL HIGH (ref 0.0–2.0)
Bicarbonate: 17.8 mmol/L — ABNORMAL LOW (ref 20.0–28.0)
Calcium, Ion: 1.28 mmol/L (ref 1.15–1.40)
HCT: 16 % — ABNORMAL LOW (ref 36.0–46.0)
Hemoglobin: 5.4 g/dL — CL (ref 12.0–15.0)
O2 Saturation: 94 %
Patient temperature: 36.6
Potassium: 3.9 mmol/L (ref 3.5–5.1)
Sodium: 146 mmol/L — ABNORMAL HIGH (ref 135–145)
TCO2: 19 mmol/L — ABNORMAL LOW (ref 22–32)
pCO2 arterial: 44.6 mmHg (ref 32.0–48.0)
pH, Arterial: 7.208 — ABNORMAL LOW (ref 7.350–7.450)
pO2, Arterial: 82 mmHg — ABNORMAL LOW (ref 83.0–108.0)

## 2020-12-19 LAB — BASIC METABOLIC PANEL
Anion gap: 7 (ref 5–15)
BUN: 40 mg/dL — ABNORMAL HIGH (ref 8–23)
CO2: 19 mmol/L — ABNORMAL LOW (ref 22–32)
Calcium: 8.5 mg/dL — ABNORMAL LOW (ref 8.9–10.3)
Chloride: 116 mmol/L — ABNORMAL HIGH (ref 98–111)
Creatinine, Ser: 1.94 mg/dL — ABNORMAL HIGH (ref 0.44–1.00)
GFR, Estimated: 27 mL/min — ABNORMAL LOW (ref >60)
Glucose, Bld: 146 mg/dL — ABNORMAL HIGH (ref 70–99)
Potassium: 3.4 mmol/L — ABNORMAL LOW (ref 3.5–5.1)
Sodium: 142 mmol/L (ref 135–145)

## 2020-12-19 LAB — GLUCOSE, CAPILLARY
Glucose-Capillary: 119 mg/dL — ABNORMAL HIGH (ref 70–99)
Glucose-Capillary: 122 mg/dL — ABNORMAL HIGH (ref 70–99)
Glucose-Capillary: 123 mg/dL — ABNORMAL HIGH (ref 70–99)
Glucose-Capillary: 128 mg/dL — ABNORMAL HIGH (ref 70–99)
Glucose-Capillary: 150 mg/dL — ABNORMAL HIGH (ref 70–99)
Glucose-Capillary: 155 mg/dL — ABNORMAL HIGH (ref 70–99)
Glucose-Capillary: 173 mg/dL — ABNORMAL HIGH (ref 70–99)
Glucose-Capillary: 178 mg/dL — ABNORMAL HIGH (ref 70–99)

## 2020-12-19 LAB — SURGICAL PCR SCREEN
MRSA, PCR: POSITIVE — AB
Staphylococcus aureus: POSITIVE — AB

## 2020-12-19 LAB — ABO/RH: ABO/RH(D): A POS

## 2020-12-19 SURGERY — LAPAROTOMY, EXPLORATORY
Anesthesia: General | Site: Abdomen

## 2020-12-19 MED ORDER — SODIUM CHLORIDE 0.9% FLUSH
10.0000 mL | Freq: Two times a day (BID) | INTRAVENOUS | Status: DC
Start: 2020-12-19 — End: 2021-01-04
  Administered 2020-12-19 – 2020-12-20 (×3): 10 mL
  Administered 2020-12-21: 30 mL
  Administered 2020-12-21 – 2020-12-22 (×2): 10 mL
  Administered 2020-12-22: 20 mL
  Administered 2020-12-23: 30 mL
  Administered 2020-12-23 – 2020-12-24 (×2): 10 mL
  Administered 2020-12-24: 30 mL
  Administered 2020-12-25 – 2020-12-27 (×6): 10 mL
  Administered 2020-12-28 (×2): 20 mL
  Administered 2020-12-29 (×2): 10 mL
  Administered 2020-12-30: 30 mL
  Administered 2020-12-30 – 2021-01-03 (×8): 10 mL

## 2020-12-19 MED ORDER — POLYETHYLENE GLYCOL 3350 17 G PO PACK
17.0000 g | PACK | Freq: Every day | ORAL | Status: DC
  Administered 2020-12-22 – 2020-12-23 (×2): 17 g
  Filled 2020-12-19 (×4): qty 1

## 2020-12-19 MED ORDER — PROPOFOL 1000 MG/100ML IV EMUL
0.0000 ug/kg/min | INTRAVENOUS | Status: DC
  Filled 2020-12-19: qty 100

## 2020-12-19 MED ORDER — FENTANYL CITRATE (PF) 100 MCG/2ML IJ SOLN
25.0000 ug | INTRAMUSCULAR | Status: DC | PRN
  Administered 2020-12-25: 100 ug via INTRAVENOUS
  Administered 2020-12-28: 50 ug via INTRAVENOUS
  Filled 2020-12-19 (×2): qty 2

## 2020-12-19 MED ORDER — ACETAMINOPHEN 325 MG PO TABS
650.0000 mg | ORAL_TABLET | Freq: Four times a day (QID) | ORAL | Status: DC
  Administered 2020-12-19 – 2020-12-21 (×2): 650 mg via ORAL
  Filled 2020-12-19 (×3): qty 2

## 2020-12-19 MED ORDER — FENTANYL 2500MCG IN NS 250ML (10MCG/ML) PREMIX INFUSION
0.0000 ug/h | INTRAVENOUS | Status: DC
Start: 2020-12-19 — End: 2020-12-29
  Administered 2020-12-19: 50 ug/h via INTRAVENOUS
  Administered 2020-12-20: 100 ug/h via INTRAVENOUS
  Administered 2020-12-22: 50 ug/h via INTRAVENOUS
  Filled 2020-12-19 (×3): qty 250

## 2020-12-19 MED ORDER — NOREPINEPHRINE 4 MG/250ML-% IV SOLN
0.0000 ug/min | INTRAVENOUS | Status: DC
  Administered 2020-12-19: 4 ug/min via INTRAVENOUS
  Administered 2020-12-19: 12 ug/min via INTRAVENOUS
  Administered 2020-12-20: 3 ug/min via INTRAVENOUS
  Administered 2020-12-20: 15 ug/min via INTRAVENOUS
  Administered 2020-12-20 (×2): 20 ug/min via INTRAVENOUS
  Filled 2020-12-19 (×5): qty 250

## 2020-12-19 MED ORDER — PIPERACILLIN-TAZOBACTAM IN DEX 2-0.25 GM/50ML IV SOLN
2.2500 g | Freq: Once | INTRAVENOUS | Status: AC
  Administered 2020-12-19: 2.25 g via INTRAVENOUS
  Filled 2020-12-19: qty 50

## 2020-12-19 MED ORDER — ALBUMIN HUMAN 5 % IV SOLN: INTRAVENOUS | Status: DC | PRN

## 2020-12-19 MED ORDER — PROPOFOL 500 MG/50ML IV EMUL
INTRAVENOUS | Status: DC | PRN
  Administered 2020-12-19: 75 ug/kg/min via INTRAVENOUS

## 2020-12-19 MED ORDER — SODIUM CHLORIDE 0.9% FLUSH: 10.0000 mL | INTRAVENOUS | Status: DC | PRN

## 2020-12-19 MED ORDER — ETOMIDATE 2 MG/ML IV SOLN
20.0000 mg | Freq: Once | INTRAVENOUS | Status: AC
  Administered 2020-12-19: 20 mg via INTRAVENOUS

## 2020-12-19 MED ORDER — FENTANYL CITRATE (PF) 100 MCG/2ML IJ SOLN
25.0000 ug | INTRAMUSCULAR | Status: DC | PRN
  Filled 2020-12-19: qty 2

## 2020-12-19 MED ORDER — 0.9 % SODIUM CHLORIDE (POUR BTL) OPTIME
TOPICAL | Status: DC | PRN
  Administered 2020-12-19 (×5): 1000 mL

## 2020-12-19 MED ORDER — PROPOFOL 1000 MG/100ML IV EMUL
5.0000 ug/kg/min | INTRAVENOUS | Status: DC
  Administered 2020-12-19: 20 ug/kg/min via INTRAVENOUS
  Administered 2020-12-19: 30 ug/kg/min via INTRAVENOUS
  Administered 2020-12-19: 50 ug/kg/min via INTRAVENOUS
  Administered 2020-12-20: 15 ug/kg/min via INTRAVENOUS
  Administered 2020-12-20: 30 ug/kg/min via INTRAVENOUS
  Administered 2020-12-21: 10 ug/kg/min via INTRAVENOUS
  Filled 2020-12-19 (×5): qty 100

## 2020-12-19 MED ORDER — PHENYLEPHRINE 40 MCG/ML (10ML) SYRINGE FOR IV PUSH (FOR BLOOD PRESSURE SUPPORT)
PREFILLED_SYRINGE | INTRAVENOUS | Status: DC | PRN
  Administered 2020-12-19: 40 ug via INTRAVENOUS

## 2020-12-19 MED ORDER — ROCURONIUM BROMIDE 50 MG/5ML IV SOLN
100.0000 mg | Freq: Once | INTRAVENOUS | Status: AC
  Administered 2020-12-19: 100 mg via INTRAVENOUS

## 2020-12-19 MED ORDER — NOREPINEPHRINE 4 MG/250ML-% IV SOLN
INTRAVENOUS | Status: DC | PRN
  Administered 2020-12-19: 2 ug/min via INTRAVENOUS

## 2020-12-19 MED ORDER — PROPOFOL 10 MG/ML IV BOLUS
INTRAVENOUS | Status: AC
  Filled 2020-12-19: qty 20

## 2020-12-19 MED ORDER — SODIUM CHLORIDE 0.9% IV SOLUTION: Freq: Once | INTRAVENOUS | Status: AC

## 2020-12-19 MED ORDER — DOCUSATE SODIUM 50 MG/5ML PO LIQD
100.0000 mg | Freq: Two times a day (BID) | ORAL | Status: DC
  Administered 2020-12-19 – 2020-12-23 (×7): 100 mg
  Filled 2020-12-19 (×10): qty 10

## 2020-12-19 MED ORDER — CEFAZOLIN SODIUM-DEXTROSE 1-4 GM/50ML-% IV SOLN
INTRAVENOUS | Status: DC | PRN
  Administered 2020-12-19: 2 g via INTRAVENOUS

## 2020-12-19 MED ORDER — SODIUM CHLORIDE 0.9 % IV SOLN
INTRAVENOUS | Status: DC | PRN
  Administered 2020-12-19: 50 ug/h via INTRAVENOUS

## 2020-12-19 MED ORDER — SODIUM CHLORIDE 0.9 % IV SOLN: 10.0000 mL/h | Freq: Once | INTRAVENOUS | Status: AC

## 2020-12-19 MED ORDER — LACTATED RINGERS IV SOLN: INTRAVENOUS | Status: DC | PRN

## 2020-12-19 MED ORDER — NOREPINEPHRINE 4 MG/250ML-% IV SOLN
INTRAVENOUS | Status: AC
  Filled 2020-12-19: qty 250

## 2020-12-19 MED ORDER — PHENYLEPHRINE HCL-NACL 10-0.9 MG/250ML-% IV SOLN
INTRAVENOUS | Status: DC | PRN
  Administered 2020-12-19: 50 ug/min via INTRAVENOUS

## 2020-12-19 MED ORDER — PIPERACILLIN-TAZOBACTAM 3.375 G IVPB
3.3750 g | Freq: Three times a day (TID) | INTRAVENOUS | Status: DC
  Administered 2020-12-19 – 2020-12-21 (×5): 3.375 g via INTRAVENOUS
  Filled 2020-12-19 (×5): qty 50

## 2020-12-19 MED ORDER — ORAL CARE MOUTH RINSE
15.0000 mL | OROMUCOSAL | Status: DC
  Administered 2020-12-19 – 2021-01-03 (×148): 15 mL via OROMUCOSAL

## 2020-12-19 MED ORDER — CHLORHEXIDINE GLUCONATE 0.12% ORAL RINSE (MEDLINE KIT)
15.0000 mL | Freq: Two times a day (BID) | OROMUCOSAL | Status: DC
  Administered 2020-12-19 – 2021-01-03 (×29): 15 mL via OROMUCOSAL

## 2020-12-19 MED ORDER — ROCURONIUM BROMIDE 10 MG/ML (PF) SYRINGE
PREFILLED_SYRINGE | INTRAVENOUS | Status: DC | PRN
  Administered 2020-12-19: 50 mg via INTRAVENOUS

## 2020-12-19 MED ORDER — PANTOPRAZOLE SODIUM 40 MG IV SOLR
40.0000 mg | INTRAVENOUS | Status: DC
  Administered 2020-12-20 – 2020-12-23 (×4): 40 mg via INTRAVENOUS
  Filled 2020-12-19 (×4): qty 40

## 2020-12-19 MED ORDER — MUPIROCIN 2 % EX OINT
1.0000 "application " | TOPICAL_OINTMENT | Freq: Two times a day (BID) | CUTANEOUS | Status: AC
  Administered 2020-12-19 – 2020-12-23 (×10): 1 via NASAL
  Filled 2020-12-19 (×3): qty 22

## 2020-12-19 SURGICAL SUPPLY — 47 items
APL PRP STRL LF DISP 70% ISPRP (MISCELLANEOUS) ×1
BLADE CLIPPER SURG (BLADE) IMPLANT
CANISTER SUCT 3000ML PPV (MISCELLANEOUS) ×2 IMPLANT
CANISTER WOUNDNEG PRESSURE 500 (CANNISTER) ×1 IMPLANT
CHLORAPREP W/TINT 26 (MISCELLANEOUS) ×2 IMPLANT
COVER SURGICAL LIGHT HANDLE (MISCELLANEOUS) ×2 IMPLANT
DRAPE LAPAROSCOPIC ABDOMINAL (DRAPES) ×2 IMPLANT
DRAPE UNIVERSAL (DRAPES) ×2 IMPLANT
DRAPE WARM FLUID 44X44 (DRAPES) ×2 IMPLANT
DRSG OPSITE POSTOP 4X10 (GAUZE/BANDAGES/DRESSINGS) IMPLANT
DRSG OPSITE POSTOP 4X8 (GAUZE/BANDAGES/DRESSINGS) IMPLANT
ELECT BLADE 6.5 EXT (BLADE) IMPLANT
ELECT CAUTERY BLADE 6.4 (BLADE) ×2 IMPLANT
ELECT REM PT RETURN 9FT ADLT (ELECTROSURGICAL) ×2
ELECTRODE REM PT RTRN 9FT ADLT (ELECTROSURGICAL) ×1 IMPLANT
GLOVE SURG ENC MOIS LTX SZ6.5 (GLOVE) ×2 IMPLANT
GLOVE SURG UNDER POLY LF SZ6 (GLOVE) ×2 IMPLANT
GOWN STRL REUS W/ TWL LRG LVL3 (GOWN DISPOSABLE) ×2 IMPLANT
GOWN STRL REUS W/TWL LRG LVL3 (GOWN DISPOSABLE) ×4
HANDLE SUCTION POOLE (INSTRUMENTS) ×1 IMPLANT
KIT BASIN OR (CUSTOM PROCEDURE TRAY) ×2 IMPLANT
KIT TURNOVER KIT B (KITS) ×2 IMPLANT
LIGASURE IMPACT 36 18CM CVD LR (INSTRUMENTS) ×1 IMPLANT
NS IRRIG 1000ML POUR BTL (IV SOLUTION) ×4 IMPLANT
PACK GENERAL/GYN (CUSTOM PROCEDURE TRAY) ×2 IMPLANT
PAD ARMBOARD 7.5X6 YLW CONV (MISCELLANEOUS) ×2 IMPLANT
PENCIL SMOKE EVACUATOR (MISCELLANEOUS) ×2 IMPLANT
RELOAD PROXIMATE 75MM BLUE (ENDOMECHANICALS) ×6 IMPLANT
RELOAD STAPLE 75 3.8 BLU REG (ENDOMECHANICALS) IMPLANT
SPONGE ABD ABTHERA ADVANCE (MISCELLANEOUS) ×1 IMPLANT
SPONGE T-LAP 18X18 ~~LOC~~+RFID (SPONGE) ×2 IMPLANT
STAPLER GUN LINEAR PROX 60 (STAPLE) ×1 IMPLANT
STAPLER PROXIMATE 75MM BLUE (STAPLE) ×1 IMPLANT
STAPLER VISISTAT 35W (STAPLE) ×2 IMPLANT
SUCTION POOLE HANDLE (INSTRUMENTS) ×2
SUT ETHILON 2 0 FS 18 (SUTURE) ×1 IMPLANT
SUT PDS AB 1 TP1 54 (SUTURE) IMPLANT
SUT PDS AB 1 TP1 96 (SUTURE) IMPLANT
SUT SILK 2 0 SH (SUTURE) ×2 IMPLANT
SUT SILK 2 0 SH CR/8 (SUTURE) ×2 IMPLANT
SUT SILK 2 0 TIES 10X30 (SUTURE) ×2 IMPLANT
SUT SILK 3 0 SH CR/8 (SUTURE) ×2 IMPLANT
SUT SILK 3 0 TIES 10X30 (SUTURE) ×2 IMPLANT
SUT VIC AB 3-0 SH 18 (SUTURE) IMPLANT
TOWEL GREEN STERILE (TOWEL DISPOSABLE) ×2 IMPLANT
TRAY FOLEY MTR SLVR 16FR STAT (SET/KITS/TRAYS/PACK) IMPLANT
YANKAUER SUCT BULB TIP NO VENT (SUCTIONS) IMPLANT

## 2020-12-19 NOTE — Procedures (Signed)
Intubation Procedure Note  MALLISA ALAMEDA  [id number]  [birth year]  Date:01/11/2021  Time:10:49 AM   Provider Performing:Anyesha N Giavana Rooke    Procedure: Intubation (31500)  Indication(s) Respiratory Failure  Consent Risks of the procedure as well as the alternatives and risks of each were explained to the patient and/or caregiver.  Consent for the procedure was obtained and is signed in the bedside chart   Anesthesia Etomidate and Rocuronium   Time Out Verified patient identification, verified procedure, site/side was marked, verified correct patient position, special equipment/implants available, medications/allergies/relevant history reviewed, required imaging and test results available.   Sterile Technique Usual hand hygeine, masks, and gloves were used   Procedure Description Patient positioned in bed supine.  Sedation given as noted above.  Patient was intubated with endotracheal tube using Glidescope.  View was Grade 1 full glottis .  Number of attempts was 1.  Colorimetric CO2 detector was consistent with tracheal placement.   Complications/Tolerance None; patient tolerated the procedure well. Chest X-ray is ordered to verify placement.   EBL none   Specimen(s) None

## 2020-12-19 NOTE — Progress Notes (Addendum)
This RN was called into the patient's room by [employer], RN. She was notified by Tele that patient is now having intermittent, PAC, and PVC. Patient is having tachypnea, labored breathing. Sats at 89 on room air. Non-rebreather mask was applied, Vitals checked with BS. RRT notified, son at bedside and all questions answered. 10:19 Rapid response RN at bedside, suction set up. Patient is barely responsive. 10:23 Patient was moved to the ICU.

## 2020-12-19 NOTE — Procedures (Signed)
   Procedure Note  Date: 12/30/2020  Procedure: central venous catheter placement--right, internal jugular vein, with ultrasound guidance  Pre-op diagnosis: hypotension, need for administration of vasopressors Post-op diagnosis: same  Surgeon: Diamantina Monks, MD  Anesthesia: local  EBL: <5cc Drains/Implants: 16 cm, triple lumen central venous catheter  Description of procedure: Time-out was performed verifying correct patient, procedure, site, laterality, and signature of informed consent. The right chest was prepped and draped in the usual sterile fashion. Thre attempts made to access the R subclavian and were unsuccessful. The right neck was then prepped and draped in the usual sterile fashion. The right internal jugular vein was localized with ultrasound guidance. Five ccs of local anesthetic was infiltrated at the site of venous access. The right internal jugular vein was accessed using an introducer needle and a guidewire passed through the needle. The needle was removed and a skin nick was made. The tract was dilated and the central venous catheter advanced over the guidewire followed by removal of the guidewire. All ports drew blood easily and all were flushed with saline. The catheter was secured to the skin with suture and a sterile dressing. The patient tolerated the procedure well. There were no immediate complications. \Follow up chest x-ray was ordered to confirm positioning and the absence of a pneumothorax.   Diamantina Monks, MD General and Trauma Surgery [facility]
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
## 2020-12-19 NOTE — Progress Notes (Signed)
For some reason, unknown to the RN, the ABG results from 7/7 14:12 are not transferring to the computer from the [place].   pH: 7.246 pCO2:36.1 pO2: 110  BE,B: -11 HCO3: 15.7 TCO2: 17 sO2%: 97

## 2020-12-19 NOTE — Anesthesia Postprocedure Evaluation (Signed)
Anesthesia Post Note  Patient: INFINITY JEFFORDS  Procedure(s) Performed: EXPLORATORY LAPAROTOMY (Abdomen) SMALL BOWEL RESECTION (Abdomen) APPLICATION OF WOUND VAC (Abdomen)     Patient location during evaluation: ICU Anesthesia Type: General Level of consciousness: sedated and patient remains intubated per anesthesia plan Pain management: pain level controlled Vital Signs Assessment: post-procedure vital signs reviewed and stable Respiratory status: patient remains intubated per anesthesia plan Cardiovascular status: blood pressure returned to baseline Postop Assessment: no apparent nausea or vomiting Anesthetic complications: no   No notable events documented.  Last Vitals:  Vitals:   01/04/2021 1100 01/01/2021 1300  BP: (!) 131/54   Pulse: (!) 108   Resp: 17   Temp:  36.7 C  SpO2: 97%     Last Pain:  Vitals:   01/10/2021 1300  TempSrc: Axillary  PainSc:                  Kyrstan Gotwalt L Averill Pons

## 2020-12-19 NOTE — Transfer of Care (Signed)
Immediate Anesthesia Transfer of Care Note  Patient: Theresa Hunter  Procedure(s) Performed: EXPLORATORY LAPAROTOMY (Abdomen) SMALL BOWEL RESECTION (Abdomen) APPLICATION OF WOUND VAC (Abdomen)  Patient Location: PACU and ICU  Anesthesia Type:General  Level of Consciousness: Patient remains intubated per anesthesia plan  Airway & Oxygen Therapy: Patient placed on Ventilator (see vital sign flow sheet for setting)  Post-op Assessment: Report given to RN and Post -op Vital signs reviewed and stable  Post vital signs: Reviewed, stable and unstable  Last Vitals:  Vitals Value Taken Time  BP    Temp    Pulse    Resp    SpO2      Last Pain:  Vitals:   12/16/2020 1024  TempSrc: Axillary  PainSc:       Patients Stated Pain Goal: 3 (12/18/20 2133)  Complications: No notable events documented.
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
## 2020-12-19 NOTE — Progress Notes (Signed)
NT Arna Medici called me into the room to assess patients mouth as she noticed her breathing was sounding raspy or obstructed like, upon inspecting her mouth we noticed that patient's tongue is retracting into her throat, patient was able to stick her tongue out when instructed to do so but immediately retracts backward. Advised NT to sit her up.  PA Barnetta Chapel did come in and assess, orders to follow.
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
## 2020-12-19 NOTE — Progress Notes (Signed)
PT Cancellation Note  Patient Details Name: Theresa Hunter MRN: [id number] DOB: 08-10-1950   Cancelled Treatment:    Reason Eval/Treat Not Completed: Medical issues which prohibited therapy. Went to see pt and breathing somewhat labored. SpO2 91% on 2L. Scooted pt up in bed and incr HOB. Will defer treatment at this tiime.    [name] 12/13/2020, 10:10 AM Skip Mayer PT Acute Rehabilitation Services Pager [phone] Office [phone]

## 2020-12-20 ENCOUNTER — Inpatient Hospital Stay (HOSPITAL_COMMUNITY): Admission: EM | Disposition: E | Payer: Self-pay | Source: Home / Self Care

## 2020-12-20 ENCOUNTER — Encounter (HOSPITAL_COMMUNITY): Payer: Self-pay | Admitting: Surgery

## 2020-12-20 LAB — BASIC METABOLIC PANEL
Anion gap: 10 (ref 5–15)
BUN: 49 mg/dL — ABNORMAL HIGH (ref 8–23)
CO2: 16 mmol/L — ABNORMAL LOW (ref 22–32)
Calcium: 8.1 mg/dL — ABNORMAL LOW (ref 8.9–10.3)
Chloride: 116 mmol/L — ABNORMAL HIGH (ref 98–111)
Creatinine, Ser: 2.54 mg/dL — ABNORMAL HIGH (ref 0.44–1.00)
GFR, Estimated: 20 mL/min — ABNORMAL LOW (ref >60)
Glucose, Bld: 154 mg/dL — ABNORMAL HIGH (ref 70–99)
Potassium: 3.2 mmol/L — ABNORMAL LOW (ref 3.5–5.1)
Sodium: 142 mmol/L (ref 135–145)

## 2020-12-20 LAB — GLUCOSE, CAPILLARY
Glucose-Capillary: 149 mg/dL — ABNORMAL HIGH (ref 70–99)
Glucose-Capillary: 151 mg/dL — ABNORMAL HIGH (ref 70–99)
Glucose-Capillary: 169 mg/dL — ABNORMAL HIGH (ref 70–99)
Glucose-Capillary: 153 mg/dL — ABNORMAL HIGH (ref 70–99)
Glucose-Capillary: 107 mg/dL — ABNORMAL HIGH (ref 70–99)

## 2020-12-20 LAB — POCT I-STAT 7, (LYTES, BLD GAS, ICA,H+H)
Acid-base deficit: 11 mmol/L — ABNORMAL HIGH (ref 0.0–2.0)
Acid-base deficit: 10 mmol/L — ABNORMAL HIGH (ref 0.0–2.0)
Bicarbonate: 15.7 mmol/L — ABNORMAL LOW (ref 20.0–28.0)
Bicarbonate: 16.9 mmol/L — ABNORMAL LOW (ref 20.0–28.0)
Calcium, Ion: 1.22 mmol/L (ref 1.15–1.40)
Calcium, Ion: 1.1 mmol/L — ABNORMAL LOW (ref 1.15–1.40)
HCT: 27 % — ABNORMAL LOW (ref 36.0–46.0)
HCT: 29 % — ABNORMAL LOW (ref 36.0–46.0)
Hemoglobin: 9.2 g/dL — ABNORMAL LOW (ref 12.0–15.0)
Hemoglobin: 9.9 g/dL — ABNORMAL LOW (ref 12.0–15.0)
O2 Saturation: 97 %
O2 Saturation: 95 %
Patient temperature: 98.1
Patient temperature: 100
Potassium: 3.8 mmol/L (ref 3.5–5.1)
Potassium: 3.4 mmol/L — ABNORMAL LOW (ref 3.5–5.1)
Sodium: 146 mmol/L — ABNORMAL HIGH (ref 135–145)
Sodium: 144 mmol/L (ref 135–145)
TCO2: 17 mmol/L — ABNORMAL LOW (ref 22–32)
TCO2: 18 mmol/L — ABNORMAL LOW (ref 22–32)
pCO2 arterial: 35.7 mmHg (ref 32.0–48.0)
pCO2 arterial: 40.6 mmHg (ref 32.0–48.0)
pH, Arterial: 7.25 — ABNORMAL LOW (ref 7.350–7.450)
pH, Arterial: 7.231 — ABNORMAL LOW (ref 7.350–7.450)
pO2, Arterial: 108 mmHg (ref 83.0–108.0)
pO2, Arterial: 93 mmHg (ref 83.0–108.0)

## 2020-12-20 LAB — CBC
HCT: 33.2 % — ABNORMAL LOW (ref 36.0–46.0)
Hemoglobin: 11.2 g/dL — ABNORMAL LOW (ref 12.0–15.0)
MCH: 29.9 pg (ref 26.0–34.0)
MCHC: 33.7 g/dL (ref 30.0–36.0)
MCV: 88.8 fL (ref 80.0–100.0)
Platelets: 208 10*3/uL (ref 150–400)
RBC: 3.74 MIL/uL — ABNORMAL LOW (ref 3.87–5.11)
RDW: 19.9 % — ABNORMAL HIGH (ref 11.5–15.5)
WBC: 8.4 10*3/uL (ref 4.0–10.5)
nRBC: 3.6 % — ABNORMAL HIGH (ref 0.0–0.2)

## 2020-12-20 LAB — TRIGLYCERIDES: Triglycerides: 272 mg/dL — ABNORMAL HIGH (ref <150)

## 2020-12-20 LAB — PATHOLOGIST SMEAR REVIEW

## 2020-12-20 SURGERY — OPEN REDUCTION INTERNAL FIXATION (ORIF) WRIST FRACTURE
Anesthesia: Choice | Site: Wrist | Laterality: Right

## 2020-12-20 MED ORDER — POTASSIUM CHLORIDE 10 MEQ/50ML IV SOLN
10.0000 meq | INTRAVENOUS | Status: AC
  Administered 2020-12-20 (×3): 10 meq via INTRAVENOUS
  Filled 2020-12-20 (×3): qty 50

## 2020-12-20 MED ORDER — ALBUMIN HUMAN 5 % IV SOLN
25.0000 g | Freq: Once | INTRAVENOUS | Status: AC
  Administered 2020-12-20: 25 g via INTRAVENOUS
  Filled 2020-12-20: qty 500

## 2020-12-20 MED ORDER — LACTATED RINGERS IV BOLUS
500.0000 mL | Freq: Once | INTRAVENOUS | Status: AC
  Administered 2020-12-20: 500 mL via INTRAVENOUS

## 2020-12-20 MED ORDER — SODIUM CHLORIDE 0.9 % IV SOLN: INTRAVENOUS | Status: DC | PRN

## 2020-12-20 MED ORDER — LACTATED RINGERS IV BOLUS
1000.0000 mL | Freq: Once | INTRAVENOUS | Status: AC
  Administered 2020-12-20: 1000 mL via INTRAVENOUS

## 2020-12-20 MED ORDER — LACTATED RINGERS IV SOLN: INTRAVENOUS | Status: DC

## 2020-12-20 MED ORDER — SODIUM BICARBONATE 8.4 % IV SOLN
50.0000 meq | Freq: Once | INTRAVENOUS | Status: AC
  Administered 2020-12-20: 50 meq via INTRAVENOUS
  Filled 2020-12-20: qty 50

## 2020-12-20 NOTE — Anesthesia Preprocedure Evaluation (Addendum)
Anesthesia Evaluation  Patient identified by MRN, date of birth, ID band Patient unresponsive    Reviewed: Allergy & Precautions, H&P , NPO status , Patient's Chart, lab work & pertinent test results  Airway Mallampati: Intubated       Dental no notable dental hx. (+) Dental Advisory Given   Pulmonary neg pulmonary ROS,  Ventilated and sedated   Pulmonary exam normal breath sounds clear to auscultation       Cardiovascular Exercise Tolerance: Good negative cardio ROS   Rhythm:Regular Rate:Normal     Neuro/Psych negative neurological ROS  negative psych ROS   GI/Hepatic negative GI ROS, Neg liver ROS,   Endo/Other  negative endocrine ROS  Renal/GU negative Renal ROS  negative genitourinary   Musculoskeletal   Abdominal   Peds  Hematology negative hematology ROS (+)   Anesthesia Other Findings   Reproductive/Obstetrics negative OB ROS                            Anesthesia Physical Anesthesia Plan  ASA: 4  Anesthesia Plan: General   Post-op Pain Management:    Induction: Intravenous  PONV Risk Score and Plan: 3 and Treatment may vary due to age or medical condition  Airway Management Planned: Oral ETT  Additional Equipment:   Intra-op Plan:   Post-operative Plan: Post-operative intubation/ventilation  Informed Consent: I have reviewed the patients History and Physical, chart, labs and discussed the procedure including the risks, benefits and alternatives for the proposed anesthesia with the patient or authorized representative who has indicated his/her understanding and acceptance.     Dental advisory given  Plan Discussed with: CRNA  Anesthesia Plan Comments:        Anesthesia Quick Evaluation
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
## 2020-12-20 NOTE — Progress Notes (Addendum)
Trauma Response Nurse Note-  Reason for Call / Reason for Trauma activation:   -Primary RN notified this RN that patients fingers on the left hand were turning blue  Initial Focused Assessment (If applicable, or please see trauma documentation):  - TRN arrived. Pt in room on bed, intubated. On assessment it was noted that patient has an arterial line to the left wrist and the right wrist has a splint on it. Fingers on the left hand were blue tinged, cold to the touch with a delayed capillary refill. Fingers on the right hand noted to be warm, normal color for patients skin tone and had a capillary refill less <2 seconds.   Interventions:  -Arterial line to the left wrist removed   Event Summary:   -TRN got called to assess patients fingers on the left hand, inferior to the arterial line. Fingers on the left were cold, blue tinged and had a delayed capillary refill. Fingers on the right hand were warm to the touch, normal skin tone for patient and normal capillary refill. TRN contacted Dr. Andrey Campanile and informed him of this assessment and that the patients BP cuff was reading a blood pressure of 129/59 and the a-line was reading 87/44. TRN also informed provider that patient is on norepinephrine.  Order received from Dr. Andrey Campanile to remove a-line. Order was placed and primary RN and 4N charge RN removed arterial line with TRN present.  The Following (if applicable):    -MD notified: Dr Andrey Campanile at [phone]

## 2020-12-20 NOTE — Progress Notes (Signed)
SLP Cancellation Note  Patient Details Name: Theresa Hunter MRN: [id number] DOB: March 10, 1951   Cancelled treatment:        Pt intubated and sedated. Will follow   Royce Macadamia 12/19/2020, 3:57 PM
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
## 2020-12-20 NOTE — Progress Notes (Signed)
Pt has had only 60 mL urine output between 0700-1400. Notified MD Lovick-verbal order obtained for 1L LR to be given over 2 hours.

## 2020-12-20 NOTE — Progress Notes (Signed)
Upon repeat assessment of patient, it was noted by this RN that the patients fingers in her left hand appeared to have a blue tint, capillary refill was significantly greater than 3 seconds, and her hand was cold to touch. The patients right hand appeared to be normal. Randye Lobo, TRN and he paged Dr. Andrey Campanile.   Assessment findings were attributed to the left arterial line, and it was taken out per Dr. Tawana Scale orders.   Harriett Sine, RN

## 2020-12-21 ENCOUNTER — Encounter (HOSPITAL_COMMUNITY): Admission: EM | Disposition: E | Payer: Self-pay | Source: Home / Self Care

## 2020-12-21 ENCOUNTER — Inpatient Hospital Stay (HOSPITAL_COMMUNITY): Payer: Medicare HMO | Admitting: Anesthesiology

## 2020-12-21 ENCOUNTER — Inpatient Hospital Stay (HOSPITAL_COMMUNITY): Payer: Medicare HMO

## 2020-12-21 HISTORY — PX: LAPAROTOMY: SHX154

## 2020-12-21 LAB — GLUCOSE, CAPILLARY
Glucose-Capillary: 107 mg/dL — ABNORMAL HIGH (ref 70–99)
Glucose-Capillary: 131 mg/dL — ABNORMAL HIGH (ref 70–99)
Glucose-Capillary: 139 mg/dL — ABNORMAL HIGH (ref 70–99)
Glucose-Capillary: 139 mg/dL — ABNORMAL HIGH (ref 70–99)
Glucose-Capillary: 142 mg/dL — ABNORMAL HIGH (ref 70–99)
Glucose-Capillary: 151 mg/dL — ABNORMAL HIGH (ref 70–99)
Glucose-Capillary: 63 mg/dL — ABNORMAL LOW (ref 70–99)
Glucose-Capillary: 87 mg/dL (ref 70–99)

## 2020-12-21 LAB — SODIUM, URINE, RANDOM: Sodium, Ur: 28 mmol/L

## 2020-12-21 LAB — BASIC METABOLIC PANEL
Anion gap: 7 (ref 5–15)
Anion gap: 9 (ref 5–15)
Anion gap: 9 (ref 5–15)
BUN: 55 mg/dL — ABNORMAL HIGH (ref 8–23)
BUN: 54 mg/dL — ABNORMAL HIGH (ref 8–23)
BUN: 57 mg/dL — ABNORMAL HIGH (ref 8–23)
CO2: 18 mmol/L — ABNORMAL LOW (ref 22–32)
CO2: 18 mmol/L — ABNORMAL LOW (ref 22–32)
CO2: 17 mmol/L — ABNORMAL LOW (ref 22–32)
Calcium: 7.7 mg/dL — ABNORMAL LOW (ref 8.9–10.3)
Calcium: 7.8 mg/dL — ABNORMAL LOW (ref 8.9–10.3)
Calcium: 7.5 mg/dL — ABNORMAL LOW (ref 8.9–10.3)
Chloride: 114 mmol/L — ABNORMAL HIGH (ref 98–111)
Chloride: 112 mmol/L — ABNORMAL HIGH (ref 98–111)
Chloride: 111 mmol/L (ref 98–111)
Creatinine, Ser: 3.16 mg/dL — ABNORMAL HIGH (ref 0.44–1.00)
Creatinine, Ser: 3.21 mg/dL — ABNORMAL HIGH (ref 0.44–1.00)
Creatinine, Ser: 3.35 mg/dL — ABNORMAL HIGH (ref 0.44–1.00)
GFR, Estimated: 15 mL/min — ABNORMAL LOW (ref >60)
GFR, Estimated: 15 mL/min — ABNORMAL LOW (ref >60)
GFR, Estimated: 14 mL/min — ABNORMAL LOW (ref >60)
Glucose, Bld: 152 mg/dL — ABNORMAL HIGH (ref 70–99)
Glucose, Bld: 139 mg/dL — ABNORMAL HIGH (ref 70–99)
Glucose, Bld: 112 mg/dL — ABNORMAL HIGH (ref 70–99)
Potassium: 3.7 mmol/L (ref 3.5–5.1)
Potassium: 3.7 mmol/L (ref 3.5–5.1)
Potassium: 3.6 mmol/L (ref 3.5–5.1)
Sodium: 139 mmol/L (ref 135–145)
Sodium: 139 mmol/L (ref 135–145)
Sodium: 137 mmol/L (ref 135–145)

## 2020-12-21 LAB — CBC
HCT: 23.5 % — ABNORMAL LOW (ref 36.0–46.0)
Hemoglobin: 7.9 g/dL — ABNORMAL LOW (ref 12.0–15.0)
MCH: 30.6 pg (ref 26.0–34.0)
MCHC: 33.6 g/dL (ref 30.0–36.0)
MCV: 91.1 fL (ref 80.0–100.0)
Platelets: 112 10*3/uL — ABNORMAL LOW (ref 150–400)
RBC: 2.58 MIL/uL — ABNORMAL LOW (ref 3.87–5.11)
RDW: 20.4 % — ABNORMAL HIGH (ref 11.5–15.5)
WBC: 6.4 10*3/uL (ref 4.0–10.5)
nRBC: 0.5 % — ABNORMAL HIGH (ref 0.0–0.2)

## 2020-12-21 LAB — PHOSPHORUS: Phosphorus: 4.2 mg/dL (ref 2.5–4.6)

## 2020-12-21 LAB — CREATININE, URINE, RANDOM: Creatinine, Urine: 109.7 mg/dL

## 2020-12-21 LAB — MAGNESIUM: Magnesium: 1.6 mg/dL — ABNORMAL LOW (ref 1.7–2.4)

## 2020-12-21 SURGERY — LAPAROTOMY, EXPLORATORY
Anesthesia: General

## 2020-12-21 MED ORDER — ACETAMINOPHEN 325 MG PO TABS
650.0000 mg | ORAL_TABLET | Freq: Four times a day (QID) | ORAL | Status: DC
  Administered 2020-12-21 – 2021-01-03 (×53): 650 mg
  Filled 2020-12-21 (×51): qty 2

## 2020-12-21 MED ORDER — MIDAZOLAM HCL 2 MG/2ML IJ SOLN
INTRAMUSCULAR | Status: AC
  Filled 2020-12-21: qty 2

## 2020-12-21 MED ORDER — PIPERACILLIN-TAZOBACTAM IN DEX 2-0.25 GM/50ML IV SOLN
2.2500 g | Freq: Four times a day (QID) | INTRAVENOUS | Status: AC
  Administered 2020-12-21 – 2020-12-24 (×14): 2.25 g via INTRAVENOUS
  Filled 2020-12-21 (×16): qty 50

## 2020-12-21 MED ORDER — 0.9 % SODIUM CHLORIDE (POUR BTL) OPTIME
TOPICAL | Status: DC | PRN
  Administered 2020-12-21: 1000 mL

## 2020-12-21 MED ORDER — LACTATED RINGERS IV BOLUS
2000.0000 mL | Freq: Once | INTRAVENOUS | Status: AC
  Administered 2020-12-21: 2000 mL via INTRAVENOUS

## 2020-12-21 MED ORDER — LACTATED RINGERS IV SOLN: INTRAVENOUS | Status: DC | PRN

## 2020-12-21 MED ORDER — ROCURONIUM BROMIDE 10 MG/ML (PF) SYRINGE
PREFILLED_SYRINGE | INTRAVENOUS | Status: AC
  Filled 2020-12-21: qty 10

## 2020-12-21 MED ORDER — SODIUM CHLORIDE 0.9 % IV BOLUS
1000.0000 mL | Freq: Once | INTRAVENOUS | Status: AC
  Administered 2020-12-21: 1000 mL via INTRAVENOUS

## 2020-12-21 MED ORDER — LACTATED RINGERS IV BOLUS
1000.0000 mL | Freq: Once | INTRAVENOUS | Status: AC
  Administered 2020-12-21: 1000 mL via INTRAVENOUS

## 2020-12-21 MED ORDER — PROPOFOL 10 MG/ML IV BOLUS
INTRAVENOUS | Status: AC
  Filled 2020-12-21: qty 20

## 2020-12-21 MED ORDER — MIDAZOLAM HCL 2 MG/2ML IJ SOLN
INTRAMUSCULAR | Status: DC | PRN
  Administered 2020-12-21: 2 mg via INTRAVENOUS

## 2020-12-21 MED ORDER — ALBUMIN HUMAN 5 % IV SOLN: INTRAVENOUS | Status: DC | PRN

## 2020-12-21 MED ORDER — MAGNESIUM SULFATE IN D5W 1-5 GM/100ML-% IV SOLN
1.0000 g | Freq: Once | INTRAVENOUS | Status: AC
  Administered 2020-12-21: 1 g via INTRAVENOUS
  Filled 2020-12-21: qty 100

## 2020-12-21 MED ORDER — PIPERACILLIN-TAZOBACTAM 3.375 G IVPB: 3.3750 g | Freq: Two times a day (BID) | INTRAVENOUS | Status: DC

## 2020-12-21 MED ORDER — FENTANYL CITRATE (PF) 250 MCG/5ML IJ SOLN
INTRAMUSCULAR | Status: AC
  Filled 2020-12-21: qty 5

## 2020-12-21 MED ORDER — ROCURONIUM BROMIDE 10 MG/ML (PF) SYRINGE
PREFILLED_SYRINGE | INTRAVENOUS | Status: DC | PRN
  Administered 2020-12-21: 20 mg via INTRAVENOUS
  Administered 2020-12-21: 50 mg via INTRAVENOUS

## 2020-12-21 SURGICAL SUPPLY — 40 items
APL PRP STRL LF DISP 70% ISPRP (MISCELLANEOUS) ×1
BLADE CLIPPER SURG (BLADE) IMPLANT
CANISTER SUCT 3000ML PPV (MISCELLANEOUS) ×2 IMPLANT
CHLORAPREP W/TINT 26 (MISCELLANEOUS) ×2 IMPLANT
COVER SURGICAL LIGHT HANDLE (MISCELLANEOUS) ×2 IMPLANT
DRAPE LAPAROSCOPIC ABDOMINAL (DRAPES) ×2 IMPLANT
DRAPE UNIVERSAL (DRAPES) ×2 IMPLANT
DRAPE WARM FLUID 44X44 (DRAPES) ×2 IMPLANT
DRSG OPSITE POSTOP 4X10 (GAUZE/BANDAGES/DRESSINGS) IMPLANT
DRSG OPSITE POSTOP 4X8 (GAUZE/BANDAGES/DRESSINGS) IMPLANT
DRSG VERAFLOW VAC LRG (GAUZE/BANDAGES/DRESSINGS) ×1 IMPLANT
ELECT BLADE 6.5 EXT (BLADE) IMPLANT
ELECT CAUTERY BLADE 6.4 (BLADE) ×2 IMPLANT
ELECT REM PT RETURN 9FT ADLT (ELECTROSURGICAL) ×2
ELECTRODE REM PT RTRN 9FT ADLT (ELECTROSURGICAL) ×1 IMPLANT
GLOVE SURG ENC MOIS LTX SZ6.5 (GLOVE) ×2 IMPLANT
GLOVE SURG UNDER POLY LF SZ6 (GLOVE) ×2 IMPLANT
GOWN STRL REUS W/ TWL LRG LVL3 (GOWN DISPOSABLE) ×2 IMPLANT
GOWN STRL REUS W/TWL LRG LVL3 (GOWN DISPOSABLE) ×4
HANDLE SUCTION POOLE (INSTRUMENTS) ×1 IMPLANT
KIT BASIN OR (CUSTOM PROCEDURE TRAY) ×2 IMPLANT
KIT TURNOVER KIT B (KITS) ×2 IMPLANT
LIGASURE IMPACT 36 18CM CVD LR (INSTRUMENTS) IMPLANT
NS IRRIG 1000ML POUR BTL (IV SOLUTION) ×4 IMPLANT
PACK GENERAL/GYN (CUSTOM PROCEDURE TRAY) ×2 IMPLANT
PAD ARMBOARD 7.5X6 YLW CONV (MISCELLANEOUS) ×2 IMPLANT
PENCIL SMOKE EVACUATOR (MISCELLANEOUS) ×2 IMPLANT
SPONGE T-LAP 18X18 ~~LOC~~+RFID (SPONGE) IMPLANT
STAPLER VISISTAT 35W (STAPLE) ×2 IMPLANT
SUCTION POOLE HANDLE (INSTRUMENTS) ×2
SUT PDS AB 1 TP1 54 (SUTURE) IMPLANT
SUT PDS AB 1 TP1 96 (SUTURE) IMPLANT
SUT SILK 2 0 SH CR/8 (SUTURE) ×2 IMPLANT
SUT SILK 2 0 TIES 10X30 (SUTURE) ×2 IMPLANT
SUT SILK 3 0 SH CR/8 (SUTURE) ×2 IMPLANT
SUT SILK 3 0 TIES 10X30 (SUTURE) ×2 IMPLANT
SUT VIC AB 3-0 SH 18 (SUTURE) IMPLANT
TOWEL GREEN STERILE (TOWEL DISPOSABLE) ×2 IMPLANT
TRAY FOLEY MTR SLVR 16FR STAT (SET/KITS/TRAYS/PACK) IMPLANT
YANKAUER SUCT BULB TIP NO VENT (SUCTIONS) IMPLANT

## 2020-12-21 NOTE — Progress Notes (Signed)
Right femoral arterial line removed at 2000. Before removal, the pulses in the feet were absent and not dopplerable. The right foot was cold to the touch and cap-refill was >3 sec. After arterial line was removed and pressure was held for 10 minutes, pulses in the right foot were dopplerable again. Cap-refill still > 3 seconds. Will continue to monitor.

## 2020-12-21 NOTE — Anesthesia Postprocedure Evaluation (Signed)
Anesthesia Post Note  Patient: Theresa Hunter  Procedure(s) Performed: EXPLORATORY LAPAROTOMY     Patient location during evaluation: SICU Anesthesia Type: General Level of consciousness: sedated Pain management: pain level controlled Vital Signs Assessment: post-procedure vital signs reviewed and stable Respiratory status: patient remains intubated per anesthesia plan Cardiovascular status: stable Postop Assessment: no apparent nausea or vomiting Anesthetic complications: no   No notable events documented.  Last Vitals:  Vitals:   12/24/2020 0700 12/27/2020 0918  BP: (!) 117/57 138/61  Pulse: 73 85  Resp: 15 20  Temp:    SpO2: 100% 97%    Last Pain:  Vitals:   01/01/2021 0439  TempSrc: Axillary  PainSc:                  Jiselle Sheu,W. EDMOND

## 2020-12-21 NOTE — Progress Notes (Signed)
Called to notify MD Lovick that pt's right foot was cold to the touch, cap refill >3 seconds, and no longer had dopperable pulses. Verbal order obtained to discontinue right fem art line. Verbal order also obtained to give an additional 1L bolus of LR over 2 hours.

## 2020-12-21 NOTE — Progress Notes (Signed)
Pt noted to have increased dusky/cyanotic cold fingers. Informed MD Janee Morn who came to bedside. Right radial pulse dopplerable, cap refill >3 seconds. No new orders at this time. Discussed with MD Janee Morn continuing minimal urine output. Verbal order obtained for additional 1L 0.9% saline bolus.

## 2020-12-21 NOTE — Addendum Note (Signed)
Addendum  created 12/27/2020 0024 by Elmer Picker, MD   Clinical Note Signed

## 2020-12-21 NOTE — Progress Notes (Signed)
No urine output since 1900 and since liter bolus of LR has finished shortly after 2200. MD paged, new order to give another LR liter bolus over 2hours. Will continue to monitor.
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
## 2020-12-21 NOTE — Progress Notes (Signed)
Informed MD Janee Morn with trauma of pt's urine output only 45 mL from 0700-1300. Verbal order obtained for 1L 0.9% saline bolus.

## 2020-12-21 NOTE — Progress Notes (Signed)
PHARMACY NOTE:  ANTIMICROBIAL RENAL DOSAGE ADJUSTMENT  Current antimicrobial regimen includes a mismatch between antimicrobial dosage and estimated renal function.  As per policy approved by the Pharmacy & Therapeutics and Medical Executive Committees, the antimicrobial dosage will be adjusted accordingly.  Current antimicrobial dosage:  Zosyn 3.375 q8hr  Indication: Abdominal peritonitis  Renal Function:  Estimated Creatinine Clearance: 15.6 mL/min (A) (by C-G formula based on SCr of 3.21 mg/dL (H)).     Antimicrobial dosage has been changed to:  Zosyn 2.25 gm IV q6hr  Additional comments:   Thank you for allowing pharmacy to be a part of this patient's care.  Tera Mater, [facility] 12/31/2020 9:36 AM

## 2020-12-21 NOTE — Transfer of Care (Signed)
Immediate Anesthesia Transfer of Care Note  Patient: Theresa Hunter  Procedure(s) Performed: EXPLORATORY LAPAROTOMY  Patient Location: ICU  Anesthesia Type:General  Level of Consciousness: Patient remains intubated per anesthesia plan  Airway & Oxygen Therapy: Patient remains intubated per anesthesia plan and Patient placed on Ventilator (see vital sign flow sheet for setting)  Post-op Assessment: Report given to RN and Post -op Vital signs reviewed and stable  Post vital signs: Reviewed and stable  Last Vitals:  Vitals Value Taken Time  BP 129/60 12/24/2020 09:05  Temp    Pulse 83 12/28/2020 09:05  Resp 20 01/02/2021 09:05  SpO2 98 12/27/2020 09:05    Last Pain:  Vitals:   12/29/2020 0439  TempSrc: Axillary  PainSc:       Patients Stated Pain Goal: 3 (12/18/20 2133)  Complications: No notable events documented.
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
## 2020-12-21 NOTE — Plan of Care (Signed)
  Problem: Activity: Goal: Risk for activity intolerance will decrease Outcome: Not Progressing   Problem: Nutrition: Goal: Adequate nutrition will be maintained Outcome: Not Progressing   

## 2020-12-21 NOTE — Progress Notes (Signed)
Patient averaged around 25mL of urine an hour over the night. MD on call was paged multiple times. A total of 3L of LR was given over the night as well as a one time flush of the foley catheter. 74mL of saline was flushed into foley. All 66mL came back out along with 50mL of urine one hour later. Pt's creatinine trending up as well. Day RN updated on patient's situation over the night.

## 2020-12-21 NOTE — Progress Notes (Signed)
Orthopedic Tech Progress Note Patient Details:  Theresa Hunter May 12, 1951 [id number]  RN helped with applying splint. Patient had a few spots on wrist and elbow  Ortho Devices Type of Ortho Device: Volar splint, Cotton web roll Ortho Device/Splint Location: RUE Ortho Device/Splint Interventions: Ordered, Application, Adjustment, Removal   Post Interventions Patient Tolerated: Well Instructions Provided: Care of device  Donald Pore 12/19/2020, 11:55 AM
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
## 2020-12-22 DIAGNOSIS — L8990 Pressure ulcer of unspecified site, unspecified stage: Secondary | ICD-10-CM | POA: Insufficient documentation

## 2020-12-22 LAB — CBC
HCT: 22.4 % — ABNORMAL LOW (ref 36.0–46.0)
Hemoglobin: 7.3 g/dL — ABNORMAL LOW (ref 12.0–15.0)
MCH: 30 pg (ref 26.0–34.0)
MCHC: 32.6 g/dL (ref 30.0–36.0)
MCV: 92.2 fL (ref 80.0–100.0)
Platelets: 87 10*3/uL — ABNORMAL LOW (ref 150–400)
RBC: 2.43 MIL/uL — ABNORMAL LOW (ref 3.87–5.11)
RDW: 19.5 % — ABNORMAL HIGH (ref 11.5–15.5)
WBC: 7.6 10*3/uL (ref 4.0–10.5)
nRBC: 0.3 % — ABNORMAL HIGH (ref 0.0–0.2)

## 2020-12-22 LAB — GLUCOSE, CAPILLARY
Glucose-Capillary: 101 mg/dL — ABNORMAL HIGH (ref 70–99)
Glucose-Capillary: 98 mg/dL (ref 70–99)
Glucose-Capillary: 92 mg/dL (ref 70–99)
Glucose-Capillary: 88 mg/dL (ref 70–99)
Glucose-Capillary: 84 mg/dL (ref 70–99)

## 2020-12-22 LAB — BASIC METABOLIC PANEL
Anion gap: 9 (ref 5–15)
BUN: 57 mg/dL — ABNORMAL HIGH (ref 8–23)
CO2: 18 mmol/L — ABNORMAL LOW (ref 22–32)
Calcium: 7.9 mg/dL — ABNORMAL LOW (ref 8.9–10.3)
Chloride: 112 mmol/L — ABNORMAL HIGH (ref 98–111)
Creatinine, Ser: 3.35 mg/dL — ABNORMAL HIGH (ref 0.44–1.00)
GFR, Estimated: 14 mL/min — ABNORMAL LOW (ref >60)
Glucose, Bld: 110 mg/dL — ABNORMAL HIGH (ref 70–99)
Potassium: 3.7 mmol/L (ref 3.5–5.1)
Sodium: 139 mmol/L (ref 135–145)

## 2020-12-22 LAB — MAGNESIUM: Magnesium: 1.7 mg/dL (ref 1.7–2.4)

## 2020-12-22 LAB — PHOSPHORUS: Phosphorus: 4.2 mg/dL (ref 2.5–4.6)

## 2020-12-22 MED ORDER — MAGNESIUM SULFATE 2 GM/50ML IV SOLN
2.0000 g | Freq: Once | INTRAVENOUS | Status: AC
  Administered 2020-12-22: 2 g via INTRAVENOUS
  Filled 2020-12-22: qty 50

## 2020-12-22 MED ORDER — ALBUMIN HUMAN 5 % IV SOLN
INTRAVENOUS | Status: AC
  Administered 2020-12-22: 12.5 g
  Filled 2020-12-22: qty 250

## 2020-12-22 MED ORDER — ALBUMIN HUMAN 5 % IV SOLN: 12.5000 g | Freq: Once | INTRAVENOUS | Status: AC

## 2020-12-22 MED ORDER — LACTATED RINGERS IV BOLUS
1000.0000 mL | Freq: Once | INTRAVENOUS | Status: AC
  Administered 2020-12-22: 1000 mL via INTRAVENOUS

## 2020-12-22 MED ORDER — FUROSEMIDE 10 MG/ML IJ SOLN: 40.0000 mg | Freq: Once | INTRAMUSCULAR | Status: DC

## 2020-12-22 MED ORDER — ALBUMIN HUMAN 5 % IV SOLN
25.0000 g | Freq: Once | INTRAVENOUS | Status: AC
  Administered 2020-12-22: 25 g via INTRAVENOUS
  Filled 2020-12-22: qty 500

## 2020-12-22 MED ORDER — FUROSEMIDE 10 MG/ML IJ SOLN
20.0000 mg | Freq: Once | INTRAMUSCULAR | Status: AC
  Administered 2020-12-22: 20 mg via INTRAVENOUS
  Filled 2020-12-22: qty 2
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
## 2020-12-22 NOTE — Progress Notes (Signed)
I met with her husband at the bedside and updated him regarding her progress. Georganna Skeans, MD, MPH, FACS Please use [email or web address] to contact on call provider

## 2020-12-23 ENCOUNTER — Inpatient Hospital Stay (HOSPITAL_COMMUNITY): Payer: Medicare HMO

## 2020-12-23 LAB — GLUCOSE, CAPILLARY
Glucose-Capillary: 109 mg/dL — ABNORMAL HIGH (ref 70–99)
Glucose-Capillary: 111 mg/dL — ABNORMAL HIGH (ref 70–99)
Glucose-Capillary: 120 mg/dL — ABNORMAL HIGH (ref 70–99)
Glucose-Capillary: 42 mg/dL — CL (ref 70–99)
Glucose-Capillary: 81 mg/dL (ref 70–99)
Glucose-Capillary: 83 mg/dL (ref 70–99)
Glucose-Capillary: 88 mg/dL (ref 70–99)

## 2020-12-23 LAB — CBC
HCT: 21.9 % — ABNORMAL LOW (ref 36.0–46.0)
Hemoglobin: 7.1 g/dL — ABNORMAL LOW (ref 12.0–15.0)
MCH: 30 pg (ref 26.0–34.0)
MCHC: 32.4 g/dL (ref 30.0–36.0)
MCV: 92.4 fL (ref 80.0–100.0)
Platelets: 89 10*3/uL — ABNORMAL LOW (ref 150–400)
RBC: 2.37 MIL/uL — ABNORMAL LOW (ref 3.87–5.11)
RDW: 19.2 % — ABNORMAL HIGH (ref 11.5–15.5)
WBC: 11.6 10*3/uL — ABNORMAL HIGH (ref 4.0–10.5)
nRBC: 0.2 % (ref 0.0–0.2)

## 2020-12-23 LAB — TYPE AND SCREEN
ABO/RH(D): A POS
Antibody Screen: NEGATIVE
Unit division: 0
Unit division: 0
Unit division: 0
Unit division: 0

## 2020-12-23 LAB — BPAM RBC
Blood Product Expiration Date: 202207142359
Blood Product Expiration Date: 202207142359
Blood Product Expiration Date: 202207272359
Blood Product Expiration Date: 202207272359
ISSUE DATE / TIME: 202207071203
ISSUE DATE / TIME: 202207071203
Unit Type and Rh: 6200
Unit Type and Rh: 6200
Unit Type and Rh: 9500
Unit Type and Rh: 9500

## 2020-12-23 LAB — HEPATIC FUNCTION PANEL
ALT: 9 U/L (ref 0–44)
AST: 28 U/L (ref 15–41)
Albumin: 1.7 g/dL — ABNORMAL LOW (ref 3.5–5.0)
Alkaline Phosphatase: 42 U/L (ref 38–126)
Bilirubin, Direct: 4.4 mg/dL — ABNORMAL HIGH (ref 0.0–0.2)
Indirect Bilirubin: 2.4 mg/dL — ABNORMAL HIGH (ref 0.3–0.9)
Total Bilirubin: 6.8 mg/dL — ABNORMAL HIGH (ref 0.3–1.2)
Total Protein: 4.2 g/dL — ABNORMAL LOW (ref 6.5–8.1)

## 2020-12-23 LAB — BASIC METABOLIC PANEL
Anion gap: 11 (ref 5–15)
BUN: 70 mg/dL — ABNORMAL HIGH (ref 8–23)
CO2: 16 mmol/L — ABNORMAL LOW (ref 22–32)
Calcium: 8 mg/dL — ABNORMAL LOW (ref 8.9–10.3)
Chloride: 110 mmol/L (ref 98–111)
Creatinine, Ser: 3.84 mg/dL — ABNORMAL HIGH (ref 0.44–1.00)
GFR, Estimated: 12 mL/min — ABNORMAL LOW (ref >60)
Glucose, Bld: 98 mg/dL (ref 70–99)
Potassium: 3.7 mmol/L (ref 3.5–5.1)
Sodium: 137 mmol/L (ref 135–145)

## 2020-12-23 LAB — TRIGLYCERIDES: Triglycerides: 322 mg/dL — ABNORMAL HIGH (ref <150)

## 2020-12-23 LAB — SURGICAL PATHOLOGY

## 2020-12-23 LAB — PHOSPHORUS: Phosphorus: 4.9 mg/dL — ABNORMAL HIGH (ref 2.5–4.6)

## 2020-12-23 LAB — MAGNESIUM: Magnesium: 2.2 mg/dL (ref 1.7–2.4)

## 2020-12-23 MED ORDER — CEFAZOLIN SODIUM-DEXTROSE 2-4 GM/100ML-% IV SOLN: 2.0000 g | INTRAVENOUS | Status: AC

## 2020-12-23 MED ORDER — ALBUMIN HUMAN 5 % IV SOLN
25.0000 g | Freq: Once | INTRAVENOUS | Status: AC
  Administered 2020-12-23: 25 g via INTRAVENOUS
  Filled 2020-12-23: qty 500

## 2020-12-23 MED ORDER — PIVOT 1.5 CAL PO LIQD
1000.0000 mL | ORAL | Status: DC
  Administered 2020-12-23: 1000 mL

## 2020-12-23 MED ORDER — SODIUM CHLORIDE 0.9 % IV SOLN
INTRAVENOUS | Status: DC | PRN
  Administered 2020-12-23: 500 mL via INTRAVENOUS
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
## 2020-12-23 NOTE — Progress Notes (Signed)
Patient ID: Theresa Hunter, female   DOB: 1950-06-16, 70 y.o.   MRN: [id number] I updated her son and husband at the bedside. Theresa Gelinas, MD, MPH, FACS Please use [email or web address] to contact on call provider
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
## 2020-12-23 NOTE — Progress Notes (Signed)
SLP Cancellation Note  Patient Details Name: Theresa Hunter MRN: [id number] DOB: [birth year]   Cancelled treatment:       Reason Eval/Treat Not Completed: Medical issues which prohibited therapy (remains on vent this am). Will continue to follow.    Mahala Menghini., M.A. CCC-SLP Acute Rehabilitation Services Pager [phone] Office [phone]  12/23/2020, 8:08 AM
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
## 2020-12-24 ENCOUNTER — Encounter (HOSPITAL_COMMUNITY): Payer: Self-pay | Admitting: Surgery

## 2020-12-24 ENCOUNTER — Inpatient Hospital Stay (HOSPITAL_COMMUNITY): Payer: Medicare HMO | Admitting: Anesthesiology

## 2020-12-24 ENCOUNTER — Inpatient Hospital Stay (HOSPITAL_COMMUNITY): Payer: Medicare HMO

## 2020-12-24 ENCOUNTER — Encounter (HOSPITAL_COMMUNITY): Admission: EM | Disposition: E | Payer: Self-pay | Source: Home / Self Care

## 2020-12-24 DIAGNOSIS — I639 Cerebral infarction, unspecified: Secondary | ICD-10-CM | POA: Diagnosis not present

## 2020-12-24 HISTORY — PX: OPEN REDUCTION INTERNAL FIXATION (ORIF) DISTAL RADIAL FRACTURE: SHX5989

## 2020-12-24 LAB — CBC
HCT: 23.2 % — ABNORMAL LOW (ref 36.0–46.0)
Hemoglobin: 7.5 g/dL — ABNORMAL LOW (ref 12.0–15.0)
MCH: 29.9 pg (ref 26.0–34.0)
MCHC: 32.3 g/dL (ref 30.0–36.0)
MCV: 92.4 fL (ref 80.0–100.0)
Platelets: 105 10*3/uL — ABNORMAL LOW (ref 150–400)
RBC: 2.51 MIL/uL — ABNORMAL LOW (ref 3.87–5.11)
RDW: 18.6 % — ABNORMAL HIGH (ref 11.5–15.5)
WBC: 12.6 10*3/uL — ABNORMAL HIGH (ref 4.0–10.5)
nRBC: 0 % (ref 0.0–0.2)

## 2020-12-24 LAB — BASIC METABOLIC PANEL
Anion gap: 11 (ref 5–15)
BUN: 90 mg/dL — ABNORMAL HIGH (ref 8–23)
CO2: 16 mmol/L — ABNORMAL LOW (ref 22–32)
Calcium: 8.2 mg/dL — ABNORMAL LOW (ref 8.9–10.3)
Chloride: 109 mmol/L (ref 98–111)
Creatinine, Ser: 4.13 mg/dL — ABNORMAL HIGH (ref 0.44–1.00)
GFR, Estimated: 11 mL/min — ABNORMAL LOW (ref >60)
Glucose, Bld: 153 mg/dL — ABNORMAL HIGH (ref 70–99)
Potassium: 3.8 mmol/L (ref 3.5–5.1)
Sodium: 136 mmol/L (ref 135–145)

## 2020-12-24 LAB — GLUCOSE, CAPILLARY
Glucose-Capillary: 118 mg/dL — ABNORMAL HIGH (ref 70–99)
Glucose-Capillary: 124 mg/dL — ABNORMAL HIGH (ref 70–99)
Glucose-Capillary: 122 mg/dL — ABNORMAL HIGH (ref 70–99)
Glucose-Capillary: 141 mg/dL — ABNORMAL HIGH (ref 70–99)
Glucose-Capillary: 148 mg/dL — ABNORMAL HIGH (ref 70–99)

## 2020-12-24 SURGERY — OPEN REDUCTION INTERNAL FIXATION (ORIF) DISTAL RADIUS FRACTURE
Anesthesia: General | Laterality: Right

## 2020-12-24 MED ORDER — 0.9 % SODIUM CHLORIDE (POUR BTL) OPTIME
TOPICAL | Status: DC | PRN
  Administered 2020-12-24: 1000 mL

## 2020-12-24 MED ORDER — ENOXAPARIN SODIUM 30 MG/0.3ML IJ SOSY: 30.0000 mg | PREFILLED_SYRINGE | Freq: Two times a day (BID) | INTRAMUSCULAR | Status: DC

## 2020-12-24 MED ORDER — PANTOPRAZOLE SODIUM 40 MG PO PACK
40.0000 mg | PACK | Freq: Every day | ORAL | Status: DC
  Administered 2020-12-25 – 2021-01-03 (×10): 40 mg
  Filled 2020-12-24 (×10): qty 20

## 2020-12-24 MED ORDER — FENTANYL CITRATE (PF) 250 MCG/5ML IJ SOLN
INTRAMUSCULAR | Status: DC | PRN
  Administered 2020-12-24 (×2): 50 ug via INTRAVENOUS

## 2020-12-24 MED ORDER — OXYCODONE HCL 5 MG/5ML PO SOLN
2.5000 mg | ORAL | Status: DC | PRN
  Administered 2020-12-31 – 2021-01-02 (×5): 5 mg
  Filled 2020-12-24 (×5): qty 5

## 2020-12-24 MED ORDER — MIDAZOLAM HCL 2 MG/2ML IJ SOLN
INTRAMUSCULAR | Status: AC
  Filled 2020-12-24: qty 2

## 2020-12-24 MED ORDER — NICARDIPINE HCL IN NACL 20-0.86 MG/200ML-% IV SOLN
3.0000 mg/h | INTRAVENOUS | Status: DC
  Administered 2020-12-24: 5 mg/h via INTRAVENOUS
  Filled 2020-12-24 (×2): qty 200

## 2020-12-24 MED ORDER — MIDAZOLAM HCL 5 MG/5ML IJ SOLN
INTRAMUSCULAR | Status: DC | PRN
  Administered 2020-12-24: 1 mg via INTRAVENOUS
  Administered 2020-12-24: 2 mg via INTRAVENOUS

## 2020-12-24 MED ORDER — PROPOFOL 10 MG/ML IV BOLUS
INTRAVENOUS | Status: AC
  Filled 2020-12-24: qty 20

## 2020-12-24 MED ORDER — FENTANYL CITRATE (PF) 250 MCG/5ML IJ SOLN
INTRAMUSCULAR | Status: AC
  Filled 2020-12-24: qty 5

## 2020-12-24 MED ORDER — LACTATED RINGERS IV SOLN: INTRAVENOUS | Status: DC | PRN

## 2020-12-24 MED ORDER — PHENYLEPHRINE 40 MCG/ML (10ML) SYRINGE FOR IV PUSH (FOR BLOOD PRESSURE SUPPORT)
PREFILLED_SYRINGE | INTRAVENOUS | Status: AC
  Filled 2020-12-24: qty 10

## 2020-12-24 MED ORDER — PIVOT 1.5 CAL PO LIQD
1000.0000 mL | ORAL | Status: DC
  Administered 2020-12-24 – 2021-01-03 (×9): 1000 mL
  Filled 2020-12-24 (×5): qty 1000

## 2020-12-24 MED ORDER — ALBUMIN HUMAN 5 % IV SOLN: INTRAVENOUS | Status: DC | PRN

## 2020-12-24 MED ORDER — ROCURONIUM BROMIDE 10 MG/ML (PF) SYRINGE
PREFILLED_SYRINGE | INTRAVENOUS | Status: AC
  Filled 2020-12-24: qty 10

## 2020-12-24 MED ORDER — ROCURONIUM BROMIDE 10 MG/ML (PF) SYRINGE
PREFILLED_SYRINGE | INTRAVENOUS | Status: DC | PRN
  Administered 2020-12-24: 20 mg via INTRAVENOUS
  Administered 2020-12-24: 30 mg via INTRAVENOUS
  Administered 2020-12-24: 50 mg via INTRAVENOUS

## 2020-12-24 MED ORDER — PHENYLEPHRINE HCL-NACL 10-0.9 MG/250ML-% IV SOLN
INTRAVENOUS | Status: DC | PRN
  Administered 2020-12-24: 25 ug/min via INTRAVENOUS

## 2020-12-24 MED ORDER — HYDRALAZINE HCL 20 MG/ML IJ SOLN
10.0000 mg | INTRAMUSCULAR | Status: DC | PRN
  Administered 2020-12-24 – 2020-12-25 (×2): 10 mg via INTRAVENOUS
  Filled 2020-12-24 (×2): qty 1

## 2020-12-24 MED ORDER — FUROSEMIDE 10 MG/ML IJ SOLN
40.0000 mg | Freq: Once | INTRAMUSCULAR | Status: AC
  Administered 2020-12-24: 40 mg via INTRAVENOUS
  Filled 2020-12-24: qty 4

## 2020-12-24 SURGICAL SUPPLY — 59 items
BAG COUNTER SPONGE SURGICOUNT (BAG) ×2 IMPLANT
BAG SPNG CNTER NS LX DISP (BAG) ×1
BIT DRILL 2.2 SS TIBIAL (BIT) ×1 IMPLANT
BLADE CLIPPER SURG (BLADE) ×2 IMPLANT
BNDG CMPR 9X4 STRL LF SNTH (GAUZE/BANDAGES/DRESSINGS) ×1
BNDG ELASTIC 3X5.8 VLCR STR LF (GAUZE/BANDAGES/DRESSINGS) ×1 IMPLANT
BNDG ELASTIC 4X5.8 VLCR STR LF (GAUZE/BANDAGES/DRESSINGS) ×2 IMPLANT
BNDG ESMARK 4X9 LF (GAUZE/BANDAGES/DRESSINGS) ×2 IMPLANT
BRUSH SCRUB EZ PLAIN DRY (MISCELLANEOUS) ×4 IMPLANT
COVER SURGICAL LIGHT HANDLE (MISCELLANEOUS) ×2 IMPLANT
DECANTER SPIKE VIAL GLASS SM (MISCELLANEOUS) IMPLANT
DRAPE C-ARM 42X72 X-RAY (DRAPES) ×2 IMPLANT
DRSG EMULSION OIL 3X3 NADH (GAUZE/BANDAGES/DRESSINGS) ×2 IMPLANT
ELECT REM PT RETURN 9FT ADLT (ELECTROSURGICAL) ×2
ELECTRODE REM PT RTRN 9FT ADLT (ELECTROSURGICAL) ×1 IMPLANT
GAUZE SPONGE 4X4 12PLY STRL (GAUZE/BANDAGES/DRESSINGS) ×2 IMPLANT
GAUZE SPONGE 4X4 12PLY STRL LF (GAUZE/BANDAGES/DRESSINGS) ×1 IMPLANT
GLOVE SRG 8 PF TXTR STRL LF DI (GLOVE) ×1 IMPLANT
GLOVE SURG ENC MOIS LTX SZ7.5 (GLOVE) ×2 IMPLANT
GLOVE SURG ENC MOIS LTX SZ8 (GLOVE) ×2 IMPLANT
GLOVE SURG UNDER POLY LF SZ7.5 (GLOVE) ×2 IMPLANT
GLOVE SURG UNDER POLY LF SZ8 (GLOVE) ×2
GOWN STRL REUS W/ TWL LRG LVL3 (GOWN DISPOSABLE) ×2 IMPLANT
GOWN STRL REUS W/ TWL XL LVL3 (GOWN DISPOSABLE) ×1 IMPLANT
GOWN STRL REUS W/TWL LRG LVL3 (GOWN DISPOSABLE) ×4
GOWN STRL REUS W/TWL XL LVL3 (GOWN DISPOSABLE) ×2
K-WIRE 1.6 (WIRE) ×4
K-WIRE FX5X1.6XNS BN SS (WIRE) ×2
KIT BASIN OR (CUSTOM PROCEDURE TRAY) ×2 IMPLANT
KIT TURNOVER KIT B (KITS) ×2 IMPLANT
KWIRE FX5X1.6XNS BN SS (WIRE) IMPLANT
NDL HYPO 25GX1X1/2 BEV (NEEDLE) IMPLANT
NEEDLE HYPO 25GX1X1/2 BEV (NEEDLE) IMPLANT
NS IRRIG 1000ML POUR BTL (IV SOLUTION) ×2 IMPLANT
PACK ORTHO EXTREMITY (CUSTOM PROCEDURE TRAY) ×2 IMPLANT
PAD ARMBOARD 7.5X6 YLW CONV (MISCELLANEOUS) ×4 IMPLANT
PAD CAST 3X4 CTTN HI CHSV (CAST SUPPLIES) ×1 IMPLANT
PADDING CAST COTTON 3X4 STRL (CAST SUPPLIES) ×2
PEG LOCKING SMOOTH 2.2X16 (Screw) ×2 IMPLANT
PEG LOCKING SMOOTH 2.2X18 (Peg) ×1 IMPLANT
PEG LOCKING SMOOTH 2.2X20 (Screw) ×3 IMPLANT
PEG LOCKING SMOOTH 2.2X22 (Screw) ×1 IMPLANT
PLATE MEDIUM DVR RIGHT (Plate) ×1 IMPLANT
SCREW LOCK 12X2.7X 3 LD (Screw) IMPLANT
SCREW LOCK 14X2.7X 3 LD TPR (Screw) IMPLANT
SCREW LOCKING 2.7X12MM (Screw) ×4 IMPLANT
SCREW LOCKING 2.7X13MM (Screw) ×1 IMPLANT
SCREW LOCKING 2.7X14 (Screw) ×4 IMPLANT
SPLINT PLASTER EXTRA FAST 3X15 (CAST SUPPLIES) ×1
SPLINT PLASTER GYPS XFAST 3X15 (CAST SUPPLIES) IMPLANT
SUT ETHILON 3 0 PS 1 (SUTURE) ×4 IMPLANT
SUT VIC AB 0 CT3 27 (SUTURE) ×2 IMPLANT
SUT VIC AB 2-0 CT1 27 (SUTURE)
SUT VIC AB 2-0 CT1 TAPERPNT 27 (SUTURE) IMPLANT
SYR CONTROL 10ML LL (SYRINGE) IMPLANT
TOWEL GREEN STERILE (TOWEL DISPOSABLE) ×4 IMPLANT
TOWEL GREEN STERILE FF (TOWEL DISPOSABLE) ×2 IMPLANT
TUBE CONNECTING 12X1/4 (SUCTIONS) ×2 IMPLANT
UNDERPAD 30X36 HEAVY ABSORB (UNDERPADS AND DIAPERS) ×2 IMPLANT

## 2020-12-24 NOTE — Progress Notes (Signed)
SLP Cancellation Note  Patient Details Name: Theresa Hunter MRN: [id number] DOB: Jan 13, 1951   Cancelled treatment:       Reason Eval/Treat Not Completed: Medical issues which prohibited therapy (on vent this am). Will continue to follow.    Mahala Menghini., M.A. CCC-SLP Acute Rehabilitation Services Pager [phone] Office [phone]  16-Jan-2021, 7:52 AM
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
## 2020-12-24 NOTE — Progress Notes (Signed)
RT transported pt to and from MRI without event. 
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
## 2020-12-24 NOTE — Anesthesia Postprocedure Evaluation (Signed)
Anesthesia Post Note  Patient: Theresa Hunter  Procedure(s) Performed: OPEN REDUCTION INTERNAL FIXATION (ORIF) DISTAL RADIAL FRACTURE (Right)     Patient location during evaluation: SICU Anesthesia Type: General Level of consciousness: sedated and obtunded/minimal responses Pain management: pain level controlled Vital Signs Assessment: post-procedure vital signs reviewed and stable Respiratory status: patient remains intubated per anesthesia plan Cardiovascular status: stable Postop Assessment: no apparent nausea or vomiting Anesthetic complications: no   No notable events documented.  Last Vitals:  Vitals:   01/12/2021 1300 12/28/2020 1315  BP: (!) 142/70 (!) 163/81  Pulse: 79 82  Resp: (!) 22 (!) 26  Temp:    SpO2: 97% 97%    Last Pain:  Vitals:   12/13/2020 1200  TempSrc: Axillary  PainSc:                  Lucretia Kern
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
## 2020-12-24 NOTE — Transfer of Care (Signed)
Immediate Anesthesia Transfer of Care Note  Patient: Theresa Hunter  Procedure(s) Performed: OPEN REDUCTION INTERNAL FIXATION (ORIF) DISTAL RADIAL FRACTURE (Right)  Patient Location: ICU  Anesthesia Type:General  Level of Consciousness: Patient remains intubated per anesthesia plan  Airway & Oxygen Therapy: Patient remains intubated per anesthesia plan and Patient placed on Ventilator (see vital sign flow sheet for setting)  Post-op Assessment: Report given to RN and Post -op Vital signs reviewed and stable  Post vital signs: Reviewed and stable  Last Vitals:  Vitals Value Taken Time  BP    Temp    Pulse    Resp    SpO2      Last Pain:  Vitals:   12/28/2020 1200  TempSrc: Axillary  PainSc:       Patients Stated Pain Goal: 3 (12/18/20 2133)  Complications: No notable events documented.

## 2020-12-24 NOTE — Progress Notes (Signed)
OT Cancellation Note  Patient Details Name: Theresa Hunter MRN: [id number] DOB: Mar 02, 1951   Cancelled Treatment:    Reason Eval/Treat Not Completed: Patient at procedure or test/ unavailable;Other (comment) (Pt was in OR most of the day. OT to re-assess tomorrow, 7/13 as medically appropriate and as schedule allows.)  Flora Lipps, OTR/L Acute Rehabilitation Services Pager: [phone] Office: [phone]   Lonzo Cloud 12/17/2020, 4:33 PM
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
## 2020-12-25 ENCOUNTER — Inpatient Hospital Stay (HOSPITAL_COMMUNITY): Payer: Medicare HMO

## 2020-12-25 ENCOUNTER — Encounter (HOSPITAL_COMMUNITY): Payer: Self-pay | Admitting: Orthopedic Surgery

## 2020-12-25 DIAGNOSIS — I6389 Other cerebral infarction: Secondary | ICD-10-CM | POA: Diagnosis not present

## 2020-12-25 DIAGNOSIS — I639 Cerebral infarction, unspecified: Secondary | ICD-10-CM | POA: Diagnosis not present

## 2020-12-25 DIAGNOSIS — I6340 Cerebral infarction due to embolism of unspecified cerebral artery: Secondary | ICD-10-CM | POA: Insufficient documentation

## 2020-12-25 DIAGNOSIS — R4182 Altered mental status, unspecified: Secondary | ICD-10-CM | POA: Diagnosis not present

## 2020-12-25 DIAGNOSIS — R601 Generalized edema: Secondary | ICD-10-CM | POA: Diagnosis not present

## 2020-12-25 DIAGNOSIS — R58 Hemorrhage, not elsewhere classified: Secondary | ICD-10-CM | POA: Diagnosis not present

## 2020-12-25 DIAGNOSIS — J9622 Acute and chronic respiratory failure with hypercapnia: Secondary | ICD-10-CM

## 2020-12-25 LAB — GLUCOSE, CAPILLARY
Glucose-Capillary: 106 mg/dL — ABNORMAL HIGH (ref 70–99)
Glucose-Capillary: 122 mg/dL — ABNORMAL HIGH (ref 70–99)
Glucose-Capillary: 126 mg/dL — ABNORMAL HIGH (ref 70–99)
Glucose-Capillary: 134 mg/dL — ABNORMAL HIGH (ref 70–99)
Glucose-Capillary: 153 mg/dL — ABNORMAL HIGH (ref 70–99)
Glucose-Capillary: 157 mg/dL — ABNORMAL HIGH (ref 70–99)
Glucose-Capillary: 172 mg/dL — ABNORMAL HIGH (ref 70–99)
Glucose-Capillary: 97 mg/dL (ref 70–99)

## 2020-12-25 LAB — BASIC METABOLIC PANEL
Anion gap: 12 (ref 5–15)
BUN: 97 mg/dL — ABNORMAL HIGH (ref 8–23)
CO2: 15 mmol/L — ABNORMAL LOW (ref 22–32)
Calcium: 8.1 mg/dL — ABNORMAL LOW (ref 8.9–10.3)
Chloride: 109 mmol/L (ref 98–111)
Creatinine, Ser: 4.31 mg/dL — ABNORMAL HIGH (ref 0.44–1.00)
GFR, Estimated: 10 mL/min — ABNORMAL LOW (ref >60)
Glucose, Bld: 140 mg/dL — ABNORMAL HIGH (ref 70–99)
Potassium: 3.9 mmol/L (ref 3.5–5.1)
Sodium: 136 mmol/L (ref 135–145)

## 2020-12-25 LAB — CBC
HCT: 26.2 % — ABNORMAL LOW (ref 36.0–46.0)
Hemoglobin: 8.6 g/dL — ABNORMAL LOW (ref 12.0–15.0)
MCH: 30.3 pg (ref 26.0–34.0)
MCHC: 32.8 g/dL (ref 30.0–36.0)
MCV: 92.3 fL (ref 80.0–100.0)
Platelets: 108 10*3/uL — ABNORMAL LOW (ref 150–400)
RBC: 2.84 MIL/uL — ABNORMAL LOW (ref 3.87–5.11)
RDW: 19 % — ABNORMAL HIGH (ref 11.5–15.5)
WBC: 15 10*3/uL — ABNORMAL HIGH (ref 4.0–10.5)
nRBC: 0.3 % — ABNORMAL HIGH (ref 0.0–0.2)

## 2020-12-25 LAB — ECHOCARDIOGRAM COMPLETE BUBBLE STUDY
Area-P 1/2: 3.28 cm2
S' Lateral: 2.1 cm

## 2020-12-25 MED ORDER — FUROSEMIDE 10 MG/ML IJ SOLN
40.0000 mg | Freq: Once | INTRAMUSCULAR | Status: AC
  Administered 2020-12-25: 40 mg via INTRAVENOUS
  Filled 2020-12-25: qty 4
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
## 2020-12-25 NOTE — Progress Notes (Signed)
  Echocardiogram 2D Echocardiogram has been performed.  Theresa Hunter 12/25/2020, 2:19 PM
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
## 2020-12-25 NOTE — Progress Notes (Signed)
BLE venous duplex has been completed.  Results can be found under chart review under CV PROC. 12/25/2020 3:43 PM Daniel Johndrow RVT, RDMS
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
## 2020-12-25 NOTE — Progress Notes (Signed)
Routine EEG. Results pending  
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
## 2020-12-25 NOTE — Progress Notes (Signed)
SLP Cancellation Note  Patient Details Name: Theresa Hunter MRN: [id number] DOB: 08-Apr-1951   Cancelled treatment:       Reason Eval/Treat Not Completed: Medical issues which prohibited therapy (pt remains on vent). Will f/u as able.     Mahala Menghini., M.A. CCC-SLP Acute Rehabilitation Services Pager [phone] Office [phone]  12/25/2020, 7:22 AM
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
## 2020-12-26 DIAGNOSIS — R58 Hemorrhage, not elsewhere classified: Secondary | ICD-10-CM | POA: Diagnosis not present

## 2020-12-26 DIAGNOSIS — S32001A Stable burst fracture of unspecified lumbar vertebra, initial encounter for closed fracture: Secondary | ICD-10-CM | POA: Diagnosis not present

## 2020-12-26 DIAGNOSIS — I6340 Cerebral infarction due to embolism of unspecified cerebral artery: Secondary | ICD-10-CM | POA: Diagnosis not present

## 2020-12-26 DIAGNOSIS — R601 Generalized edema: Secondary | ICD-10-CM | POA: Diagnosis not present

## 2020-12-26 LAB — BASIC METABOLIC PANEL
Anion gap: 16 — ABNORMAL HIGH (ref 5–15)
BUN: 106 mg/dL — ABNORMAL HIGH (ref 8–23)
CO2: 14 mmol/L — ABNORMAL LOW (ref 22–32)
Calcium: 8.2 mg/dL — ABNORMAL LOW (ref 8.9–10.3)
Chloride: 106 mmol/L (ref 98–111)
Creatinine, Ser: 4.37 mg/dL — ABNORMAL HIGH (ref 0.44–1.00)
GFR, Estimated: 10 mL/min — ABNORMAL LOW (ref >60)
Glucose, Bld: 189 mg/dL — ABNORMAL HIGH (ref 70–99)
Potassium: 3.9 mmol/L (ref 3.5–5.1)
Sodium: 136 mmol/L (ref 135–145)

## 2020-12-26 LAB — GLUCOSE, CAPILLARY
Glucose-Capillary: 150 mg/dL — ABNORMAL HIGH (ref 70–99)
Glucose-Capillary: 194 mg/dL — ABNORMAL HIGH (ref 70–99)
Glucose-Capillary: 187 mg/dL — ABNORMAL HIGH (ref 70–99)
Glucose-Capillary: 234 mg/dL — ABNORMAL HIGH (ref 70–99)
Glucose-Capillary: 247 mg/dL — ABNORMAL HIGH (ref 70–99)

## 2020-12-26 LAB — CBC
HCT: 23.9 % — ABNORMAL LOW (ref 36.0–46.0)
Hemoglobin: 8.1 g/dL — ABNORMAL LOW (ref 12.0–15.0)
MCH: 30.7 pg (ref 26.0–34.0)
MCHC: 33.9 g/dL (ref 30.0–36.0)
MCV: 90.5 fL (ref 80.0–100.0)
Platelets: 113 10*3/uL — ABNORMAL LOW (ref 150–400)
RBC: 2.64 MIL/uL — ABNORMAL LOW (ref 3.87–5.11)
RDW: 18.6 % — ABNORMAL HIGH (ref 11.5–15.5)
WBC: 13.5 10*3/uL — ABNORMAL HIGH (ref 4.0–10.5)
nRBC: 0.2 % (ref 0.0–0.2)

## 2020-12-26 LAB — MAGNESIUM: Magnesium: 2.1 mg/dL (ref 1.7–2.4)

## 2020-12-26 LAB — LIPID PANEL
Cholesterol: 142 mg/dL (ref 0–200)
HDL: <10 mg/dL — ABNORMAL LOW (ref >40)
LDL Cholesterol: UNDETERMINED mg/dL (ref 0–99)
Triglycerides: 607 mg/dL — ABNORMAL HIGH (ref <150)
VLDL: UNDETERMINED mg/dL (ref 0–40)

## 2020-12-26 LAB — PHOSPHORUS: Phosphorus: 5.4 mg/dL — ABNORMAL HIGH (ref 2.5–4.6)

## 2020-12-26 LAB — LDL CHOLESTEROL, DIRECT: Direct LDL: 44.1 mg/dL (ref 0–99)

## 2020-12-26 MED ORDER — PROSOURCE TF PO LIQD
90.0000 mL | Freq: Three times a day (TID) | ORAL | Status: DC
  Administered 2020-12-26 – 2021-01-03 (×25): 90 mL
  Filled 2020-12-26 (×25): qty 90

## 2020-12-26 MED ORDER — ATORVASTATIN CALCIUM 40 MG PO TABS
40.0000 mg | ORAL_TABLET | Freq: Every day | ORAL | Status: DC
  Administered 2020-12-26 – 2021-01-03 (×9): 40 mg
  Filled 2020-12-26 (×8): qty 1

## 2020-12-26 MED ORDER — FUROSEMIDE 10 MG/ML IJ SOLN
40.0000 mg | Freq: Once | INTRAMUSCULAR | Status: AC
  Administered 2020-12-26: 40 mg via INTRAVENOUS
  Filled 2020-12-26: qty 4
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
## 2020-12-26 NOTE — Progress Notes (Signed)
SLP Cancellation Note  Patient Details Name: AOIFE BOLD MRN: [id number] DOB: 28-May-1951   Cancelled treatment:       Reason Eval/Treat Not Completed: Medical issues which prohibited therapy - remains on vent. Will continue to follow.     Mahala Menghini., M.A. CCC-SLP Acute Rehabilitation Services Pager [phone] Office [phone]  12/26/2020, 7:39 AM

## 2020-12-27 ENCOUNTER — Inpatient Hospital Stay (HOSPITAL_COMMUNITY): Payer: Medicare HMO

## 2020-12-27 LAB — GLUCOSE, CAPILLARY
Glucose-Capillary: 217 mg/dL — ABNORMAL HIGH (ref 70–99)
Glucose-Capillary: 235 mg/dL — ABNORMAL HIGH (ref 70–99)
Glucose-Capillary: 227 mg/dL — ABNORMAL HIGH (ref 70–99)
Glucose-Capillary: 192 mg/dL — ABNORMAL HIGH (ref 70–99)
Glucose-Capillary: 186 mg/dL — ABNORMAL HIGH (ref 70–99)
Glucose-Capillary: 202 mg/dL — ABNORMAL HIGH (ref 70–99)

## 2020-12-27 LAB — CBC
HCT: 21 % — ABNORMAL LOW (ref 36.0–46.0)
Hemoglobin: 7 g/dL — ABNORMAL LOW (ref 12.0–15.0)
MCH: 30.2 pg (ref 26.0–34.0)
MCHC: 33.3 g/dL (ref 30.0–36.0)
MCV: 90.5 fL (ref 80.0–100.0)
Platelets: 111 10*3/uL — ABNORMAL LOW (ref 150–400)
RBC: 2.32 MIL/uL — ABNORMAL LOW (ref 3.87–5.11)
RDW: 18.6 % — ABNORMAL HIGH (ref 11.5–15.5)
WBC: 9 10*3/uL (ref 4.0–10.5)
nRBC: 0.2 % (ref 0.0–0.2)

## 2020-12-27 LAB — BASIC METABOLIC PANEL
Anion gap: 13 (ref 5–15)
BUN: 126 mg/dL — ABNORMAL HIGH (ref 8–23)
CO2: 16 mmol/L — ABNORMAL LOW (ref 22–32)
Calcium: 8.2 mg/dL — ABNORMAL LOW (ref 8.9–10.3)
Chloride: 109 mmol/L (ref 98–111)
Creatinine, Ser: 4.55 mg/dL — ABNORMAL HIGH (ref 0.44–1.00)
GFR, Estimated: 10 mL/min — ABNORMAL LOW (ref >60)
Glucose, Bld: 205 mg/dL — ABNORMAL HIGH (ref 70–99)
Potassium: 3.5 mmol/L (ref 3.5–5.1)
Sodium: 138 mmol/L (ref 135–145)

## 2020-12-27 LAB — PHOSPHORUS: Phosphorus: 5.3 mg/dL — ABNORMAL HIGH (ref 2.5–4.6)

## 2020-12-27 LAB — MAGNESIUM: Magnesium: 2.2 mg/dL (ref 1.7–2.4)

## 2020-12-27 MED ORDER — FUROSEMIDE 10 MG/ML IJ SOLN
40.0000 mg | Freq: Once | INTRAMUSCULAR | Status: AC
  Administered 2020-12-27: 40 mg via INTRAVENOUS
  Filled 2020-12-27: qty 4

## 2020-12-27 MED ORDER — SODIUM BICARBONATE 650 MG PO TABS
1300.0000 mg | ORAL_TABLET | Freq: Two times a day (BID) | ORAL | Status: DC
  Administered 2020-12-27 – 2020-12-29 (×5): 1300 mg
  Filled 2020-12-27 (×5): qty 2

## 2020-12-27 MED ORDER — SODIUM BICARBONATE 650 MG PO TABS: 1300.0000 mg | ORAL_TABLET | Freq: Two times a day (BID) | ORAL | Status: DC

## 2020-12-27 MED ORDER — FUROSEMIDE 10 MG/ML IJ SOLN
40.0000 mg | Freq: Every day | INTRAMUSCULAR | Status: DC
  Administered 2020-12-28 – 2021-01-03 (×7): 40 mg via INTRAVENOUS
  Filled 2020-12-27 (×7): qty 4

## 2020-12-27 NOTE — Progress Notes (Signed)
SLP Cancellation Note  Patient Details Name: TRANISE FORREST MRN: [id number] DOB: 12-01-1950   Cancelled treatment:       Reason Eval/Treat Not Completed: Medical issues which prohibited therapy; still on vent. Will f/u next week for readiness.     Mahala Menghini., M.A. CCC-SLP Acute Rehabilitation Services Pager [phone] Office [phone]  12/27/2020, 7:12 AM
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
## 2020-12-27 NOTE — Procedures (Signed)
Procedures Right IJ central line noted to remain in too far on F/U chest x-ray.  The area was prepped in a sterile fashion.  I remove the sutures.  I withdrew it about 4 cm.  I placed a new dressing and sutured in place.  She tolerated this well.  Violeta Gelinas, MD, MPH, FACS Please use [email or web address] to contact on call provider
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
## 2020-12-28 ENCOUNTER — Inpatient Hospital Stay (HOSPITAL_COMMUNITY): Payer: Medicare HMO

## 2020-12-28 DIAGNOSIS — N179 Acute kidney failure, unspecified: Secondary | ICD-10-CM | POA: Diagnosis not present

## 2020-12-28 LAB — MAGNESIUM: Magnesium: 2.1 mg/dL (ref 1.7–2.4)

## 2020-12-28 LAB — GLUCOSE, CAPILLARY
Glucose-Capillary: 188 mg/dL — ABNORMAL HIGH (ref 70–99)
Glucose-Capillary: 159 mg/dL — ABNORMAL HIGH (ref 70–99)
Glucose-Capillary: 130 mg/dL — ABNORMAL HIGH (ref 70–99)
Glucose-Capillary: 206 mg/dL — ABNORMAL HIGH (ref 70–99)
Glucose-Capillary: 128 mg/dL — ABNORMAL HIGH (ref 70–99)
Glucose-Capillary: 144 mg/dL — ABNORMAL HIGH (ref 70–99)

## 2020-12-28 LAB — BASIC METABOLIC PANEL
Anion gap: 11 (ref 5–15)
BUN: 143 mg/dL — ABNORMAL HIGH (ref 8–23)
CO2: 17 mmol/L — ABNORMAL LOW (ref 22–32)
Calcium: 7.8 mg/dL — ABNORMAL LOW (ref 8.9–10.3)
Chloride: 113 mmol/L — ABNORMAL HIGH (ref 98–111)
Creatinine, Ser: 4.62 mg/dL — ABNORMAL HIGH (ref 0.44–1.00)
GFR, Estimated: 10 mL/min — ABNORMAL LOW (ref >60)
Glucose, Bld: 191 mg/dL — ABNORMAL HIGH (ref 70–99)
Potassium: 3.1 mmol/L — ABNORMAL LOW (ref 3.5–5.1)
Sodium: 141 mmol/L (ref 135–145)

## 2020-12-28 LAB — PHOSPHORUS: Phosphorus: 6 mg/dL — ABNORMAL HIGH (ref 2.5–4.6)

## 2020-12-28 LAB — RENAL FUNCTION PANEL
Albumin: 1.7 g/dL — ABNORMAL LOW (ref 3.5–5.0)
Anion gap: 17 — ABNORMAL HIGH (ref 5–15)
BUN: 141 mg/dL — ABNORMAL HIGH (ref 8–23)
CO2: 16 mmol/L — ABNORMAL LOW (ref 22–32)
Calcium: 8 mg/dL — ABNORMAL LOW (ref 8.9–10.3)
Chloride: 111 mmol/L (ref 98–111)
Creatinine, Ser: 4.34 mg/dL — ABNORMAL HIGH (ref 0.44–1.00)
GFR, Estimated: 10 mL/min — ABNORMAL LOW (ref >60)
Glucose, Bld: 234 mg/dL — ABNORMAL HIGH (ref 70–99)
Phosphorus: 5.9 mg/dL — ABNORMAL HIGH (ref 2.5–4.6)
Potassium: 3.3 mmol/L — ABNORMAL LOW (ref 3.5–5.1)
Sodium: 144 mmol/L (ref 135–145)

## 2020-12-28 LAB — CBC
HCT: 19.3 % — ABNORMAL LOW (ref 36.0–46.0)
Hemoglobin: 6.3 g/dL — CL (ref 12.0–15.0)
MCH: 30 pg (ref 26.0–34.0)
MCHC: 32.6 g/dL (ref 30.0–36.0)
MCV: 91.9 fL (ref 80.0–100.0)
Platelets: 102 10*3/uL — ABNORMAL LOW (ref 150–400)
RBC: 2.1 MIL/uL — ABNORMAL LOW (ref 3.87–5.11)
RDW: 18.1 % — ABNORMAL HIGH (ref 11.5–15.5)
WBC: 7.4 10*3/uL (ref 4.0–10.5)
nRBC: 0 % (ref 0.0–0.2)

## 2020-12-28 LAB — HEMOGLOBIN AND HEMATOCRIT, BLOOD
HCT: 26.4 % — ABNORMAL LOW (ref 36.0–46.0)
Hemoglobin: 9 g/dL — ABNORMAL LOW (ref 12.0–15.0)

## 2020-12-28 LAB — PREPARE RBC (CROSSMATCH)

## 2020-12-28 MED ORDER — POTASSIUM CHLORIDE 20 MEQ PO PACK
20.0000 meq | PACK | Freq: Once | ORAL | Status: AC
  Administered 2020-12-28: 20 meq
  Filled 2020-12-28: qty 1

## 2020-12-28 MED ORDER — GERHARDT'S BUTT CREAM
TOPICAL_CREAM | Freq: Three times a day (TID) | CUTANEOUS | Status: DC
  Administered 2020-12-28 – 2020-12-31 (×4): 1 via TOPICAL
  Filled 2020-12-28 (×2): qty 1

## 2020-12-28 MED ORDER — SODIUM CHLORIDE 0.9% IV SOLUTION: Freq: Once | INTRAVENOUS | Status: DC

## 2020-12-28 MED ORDER — PRISMASOL BGK 4/2.5 32-4-2.5 MEQ/L EC SOLN
Status: DC
  Filled 2020-12-28 (×41): qty 5000

## 2020-12-28 MED ORDER — HEPARIN SODIUM (PORCINE) 1000 UNIT/ML DIALYSIS
1000.0000 [IU] | INTRAMUSCULAR | Status: DC | PRN
  Administered 2021-01-01: 2800 [IU] via INTRAVENOUS_CENTRAL
  Filled 2020-12-28 (×2): qty 6
  Filled 2020-12-28: qty 3

## 2020-12-28 MED ORDER — SODIUM CHLORIDE 0.9 % FOR CRRT: INTRAVENOUS_CENTRAL | Status: DC | PRN

## 2020-12-28 MED ORDER — PRISMASOL BGK 4/2.5 32-4-2.5 MEQ/L REPLACEMENT SOLN
Status: DC
  Filled 2020-12-28 (×6): qty 5000

## 2020-12-28 MED ORDER — PRISMASOL BGK 4/2.5 32-4-2.5 MEQ/L REPLACEMENT SOLN
Status: DC
  Filled 2020-12-28 (×8): qty 5000

## 2020-12-28 MED ORDER — MIDAZOLAM HCL 2 MG/2ML IJ SOLN
1.0000 mg | Freq: Once | INTRAMUSCULAR | Status: AC
  Administered 2020-12-28: 1 mg via INTRAVENOUS

## 2020-12-28 MED ORDER — MIDAZOLAM HCL 2 MG/2ML IJ SOLN
INTRAMUSCULAR | Status: AC
  Filled 2020-12-28: qty 2
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
## 2020-12-28 NOTE — Consult Note (Signed)
WOC Nurse Consult Note: Reason for Consult: perineal dermatitis due to fecal soiling (FlexiSeal is in place) Wound type: irritant contact dermatitis, MASD.  ICD-10 CM Codes for Irritant Dermatitis L24A2 - Due to fecal, urinary or dual incontinence   Pressure Injury POA: N/A Measurement: perineal area with erythema and edema Wound bed: N/A Drainage (amount, consistency, odor) fecal soiling from peritube seepage of stool Periwound: macerated Dressing procedure/placement/frequency: Patient's Bedside RN, D. Everette and I discuss the peritube seepage of stool from the indwelling bowel management system. Frequent cleansing is required, specifically with every turn and reposition. We will add Gerhart's Butt cream as it is an enhanced barrier over the moisture barrier ointment currently in use.  If insufficient, we can go to a 40% Zinc Oxide product. Staff will contact the [facility] department if they deem this necessary in the future. Bilateral pressure redistribution heel boots are in place and patient is being turned and repositioned often, per house protocol.  WOC nursing team will not follow, but will remain available to this patient, the nursing and medical teams.  Please re-consult if needed. Thanks, Ladona Mow, MSN, RN, GNP, [facility]  Pager# [phone]

## 2020-12-28 NOTE — Procedures (Signed)
Central Venous Catheter Insertion Procedure Note  Theresa Hunter  [id number]  1950/07/25  Date:12/28/20  Time:2:33 PM   Provider Performing:Kaira Stringfield   Procedure: Insertion of Non-tunneled Central Venous Catheter(36556)with US guidance (18335)    Indication(s) Hemodialysis  Consent Risks of the procedure as well as the alternatives and risks of each were explained to the patient and/or caregiver.  Consent for the procedure was obtained and is signed in the bedside chart  Anesthesia Topical only with 1% lidocaine   Timeout Verified patient identification, verified procedure, site/side was marked, verified correct patient position, special equipment/implants available, medications/allergies/relevant history reviewed, required imaging and test results available.  Sterile Technique Maximal sterile technique including full sterile barrier drape, hand hygiene, sterile gown, sterile gloves, mask, hair covering, sterile ultrasound probe cover (if used).  Procedure Description Area of catheter insertion was cleaned with chlorhexidine and draped in sterile fashion.   With real-time ultrasound guidance a 20cm 83F Power Trialysis HD catheter was placed into the left internal jugular vein.  Nonpulsatile blood flow and easy flushing noted in all ports.  The catheter was sutured in place and sterile dressing applied.     Complications/Tolerance None; patient tolerated the procedure well. Chest X-ray is ordered to verify placement for internal jugular or subclavian cannulation.  Chest x-ray is not ordered for femoral cannulation.  EBL Minimal  Lynnell Catalan, MD [facility] ICU Physician [facility] [place] [facility]  Pager: [phone] Or Epic Secure Chat After hours: [phone].  12/28/2020, 2:34 PM

## 2020-12-28 NOTE — Progress Notes (Signed)
CRITICAL VALUE STICKER  CRITICAL VALUE: hbg 6.3  RECEIVER (on-site recipient of call): Leafy Ro RN  DATE & TIME NOTIFIED: 12/28/20 0745  MESSENGER (representative from lab):  MD NOTIFIED: Emelia Loron MD  TIME OF NOTIFICATION: 0800  RESPONSE:  new order to administer blood being placed by MD Dwain Sarna

## 2020-12-29 LAB — GLUCOSE, CAPILLARY
Glucose-Capillary: 152 mg/dL — ABNORMAL HIGH (ref 70–99)
Glucose-Capillary: 164 mg/dL — ABNORMAL HIGH (ref 70–99)
Glucose-Capillary: 175 mg/dL — ABNORMAL HIGH (ref 70–99)
Glucose-Capillary: 244 mg/dL — ABNORMAL HIGH (ref 70–99)
Glucose-Capillary: 235 mg/dL — ABNORMAL HIGH (ref 70–99)
Glucose-Capillary: 226 mg/dL — ABNORMAL HIGH (ref 70–99)

## 2020-12-29 LAB — RENAL FUNCTION PANEL
Albumin: 1.6 g/dL — ABNORMAL LOW (ref 3.5–5.0)
Albumin: 1.6 g/dL — ABNORMAL LOW (ref 3.5–5.0)
Anion gap: 9 (ref 5–15)
Anion gap: 10 (ref 5–15)
BUN: 90 mg/dL — ABNORMAL HIGH (ref 8–23)
BUN: 69 mg/dL — ABNORMAL HIGH (ref 8–23)
CO2: 23 mmol/L (ref 22–32)
CO2: 23 mmol/L (ref 22–32)
Calcium: 7.9 mg/dL — ABNORMAL LOW (ref 8.9–10.3)
Calcium: 7.9 mg/dL — ABNORMAL LOW (ref 8.9–10.3)
Chloride: 109 mmol/L (ref 98–111)
Chloride: 107 mmol/L (ref 98–111)
Creatinine, Ser: 2.58 mg/dL — ABNORMAL HIGH (ref 0.44–1.00)
Creatinine, Ser: 1.95 mg/dL — ABNORMAL HIGH (ref 0.44–1.00)
GFR, Estimated: 19 mL/min — ABNORMAL LOW (ref >60)
GFR, Estimated: 27 mL/min — ABNORMAL LOW (ref >60)
Glucose, Bld: 246 mg/dL — ABNORMAL HIGH (ref 70–99)
Glucose, Bld: 246 mg/dL — ABNORMAL HIGH (ref 70–99)
Phosphorus: 3.7 mg/dL (ref 2.5–4.6)
Phosphorus: 2.9 mg/dL (ref 2.5–4.6)
Potassium: 3.3 mmol/L — ABNORMAL LOW (ref 3.5–5.1)
Potassium: 4.2 mmol/L (ref 3.5–5.1)
Sodium: 141 mmol/L (ref 135–145)
Sodium: 140 mmol/L (ref 135–145)

## 2020-12-29 LAB — CBC
HCT: 25.4 % — ABNORMAL LOW (ref 36.0–46.0)
Hemoglobin: 8.5 g/dL — ABNORMAL LOW (ref 12.0–15.0)
MCH: 30.1 pg (ref 26.0–34.0)
MCHC: 33.5 g/dL (ref 30.0–36.0)
MCV: 90.1 fL (ref 80.0–100.0)
Platelets: 115 10*3/uL — ABNORMAL LOW (ref 150–400)
RBC: 2.82 MIL/uL — ABNORMAL LOW (ref 3.87–5.11)
RDW: 16.8 % — ABNORMAL HIGH (ref 11.5–15.5)
WBC: 6.7 10*3/uL (ref 4.0–10.5)
nRBC: 0.3 % — ABNORMAL HIGH (ref 0.0–0.2)

## 2020-12-29 LAB — BPAM RBC
Blood Product Expiration Date: 202208102359
ISSUE DATE / TIME: 202207161149
Unit Type and Rh: 6200

## 2020-12-29 LAB — MAGNESIUM: Magnesium: 2.2 mg/dL (ref 1.7–2.4)

## 2020-12-29 LAB — TYPE AND SCREEN
ABO/RH(D): A POS
Antibody Screen: NEGATIVE
Unit division: 0

## 2020-12-29 MED ORDER — LOPERAMIDE HCL 1 MG/7.5ML PO SUSP
2.0000 mg | Freq: Once | ORAL | Status: AC
  Administered 2020-12-29: 2 mg
  Filled 2020-12-29: qty 15

## 2020-12-29 MED ORDER — POTASSIUM CHLORIDE 20 MEQ PO PACK
20.0000 meq | PACK | Freq: Once | ORAL | Status: AC
  Administered 2020-12-29: 20 meq
  Filled 2020-12-29: qty 1

## 2020-12-29 MED ORDER — POTASSIUM CHLORIDE 20 MEQ PO PACK
40.0000 meq | PACK | Freq: Once | ORAL | Status: AC
  Administered 2020-12-29: 40 meq
  Filled 2020-12-29: qty 2
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
## 2020-12-30 DIAGNOSIS — S069X9S Unspecified intracranial injury with loss of consciousness of unspecified duration, sequela: Secondary | ICD-10-CM | POA: Diagnosis not present

## 2020-12-30 DIAGNOSIS — I6340 Cerebral infarction due to embolism of unspecified cerebral artery: Secondary | ICD-10-CM | POA: Diagnosis not present

## 2020-12-30 LAB — RENAL FUNCTION PANEL
Albumin: 1.6 g/dL — ABNORMAL LOW (ref 3.5–5.0)
Albumin: 1.5 g/dL — ABNORMAL LOW (ref 3.5–5.0)
Anion gap: 11 (ref 5–15)
Anion gap: 5 (ref 5–15)
BUN: 58 mg/dL — ABNORMAL HIGH (ref 8–23)
BUN: 46 mg/dL — ABNORMAL HIGH (ref 8–23)
CO2: 23 mmol/L (ref 22–32)
CO2: 27 mmol/L (ref 22–32)
Calcium: 8 mg/dL — ABNORMAL LOW (ref 8.9–10.3)
Calcium: 7.8 mg/dL — ABNORMAL LOW (ref 8.9–10.3)
Chloride: 106 mmol/L (ref 98–111)
Chloride: 107 mmol/L (ref 98–111)
Creatinine, Ser: 1.59 mg/dL — ABNORMAL HIGH (ref 0.44–1.00)
Creatinine, Ser: 1.31 mg/dL — ABNORMAL HIGH (ref 0.44–1.00)
GFR, Estimated: 34 mL/min — ABNORMAL LOW (ref >60)
GFR, Estimated: 43 mL/min — ABNORMAL LOW (ref >60)
Glucose, Bld: 240 mg/dL — ABNORMAL HIGH (ref 70–99)
Glucose, Bld: 242 mg/dL — ABNORMAL HIGH (ref 70–99)
Phosphorus: 2.7 mg/dL (ref 2.5–4.6)
Phosphorus: 2.4 mg/dL — ABNORMAL LOW (ref 2.5–4.6)
Potassium: 4 mmol/L (ref 3.5–5.1)
Potassium: 4 mmol/L (ref 3.5–5.1)
Sodium: 140 mmol/L (ref 135–145)
Sodium: 139 mmol/L (ref 135–145)

## 2020-12-30 LAB — CBC
HCT: 24.2 % — ABNORMAL LOW (ref 36.0–46.0)
Hemoglobin: 8.2 g/dL — ABNORMAL LOW (ref 12.0–15.0)
MCH: 30.9 pg (ref 26.0–34.0)
MCHC: 33.9 g/dL (ref 30.0–36.0)
MCV: 91.3 fL (ref 80.0–100.0)
Platelets: 119 10*3/uL — ABNORMAL LOW (ref 150–400)
RBC: 2.65 MIL/uL — ABNORMAL LOW (ref 3.87–5.11)
RDW: 16.7 % — ABNORMAL HIGH (ref 11.5–15.5)
WBC: 5.8 10*3/uL (ref 4.0–10.5)
nRBC: 0 % (ref 0.0–0.2)

## 2020-12-30 LAB — GLUCOSE, CAPILLARY
Glucose-Capillary: 176 mg/dL — ABNORMAL HIGH (ref 70–99)
Glucose-Capillary: 221 mg/dL — ABNORMAL HIGH (ref 70–99)
Glucose-Capillary: 140 mg/dL — ABNORMAL HIGH (ref 70–99)
Glucose-Capillary: 103 mg/dL — ABNORMAL HIGH (ref 70–99)
Glucose-Capillary: 154 mg/dL — ABNORMAL HIGH (ref 70–99)
Glucose-Capillary: 166 mg/dL — ABNORMAL HIGH (ref 70–99)

## 2020-12-30 LAB — MAGNESIUM: Magnesium: 2.3 mg/dL (ref 1.7–2.4)

## 2020-12-30 MED ORDER — SODIUM CHLORIDE 0.9 % IV SOLN: INTRAVENOUS | Status: DC | PRN

## 2020-12-30 MED ORDER — ROCURONIUM BROMIDE 10 MG/ML (PF) SYRINGE
PREFILLED_SYRINGE | INTRAVENOUS | Status: AC
  Filled 2020-12-30: qty 10
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
## 2020-12-30 NOTE — Progress Notes (Signed)
Called patient's spouse to set up a goals of care conversation. No answer, left VM. Will try to set up a meeting for this afternoon, inclusive of Neurology team.   Diamantina Monks, MD General and Trauma Surgery [facility] Surgery
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
## 2020-12-30 NOTE — Progress Notes (Signed)
Aline attempted RT x2 without success. RN made aware.
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
## 2020-12-30 NOTE — Progress Notes (Signed)
PT Cancellation Note  Patient Details Name: Theresa Hunter MRN: [id number] DOB: 12/22/1950   Cancelled Treatment:    Reason Eval/Treat Not Completed: Medical issues which prohibited therapy this morning. The pt remains intubated, on CRRT, and minimally responsive this morning. Per discussion with RN, will hold for today and continue therapies as medically appropriate.   Lazarus Gowda, PT, DPT   Acute Rehabilitation Department Pager #: [phone]   [name] 12/30/2020, 9:52 AM

## 2020-12-31 ENCOUNTER — Inpatient Hospital Stay (HOSPITAL_COMMUNITY): Payer: Medicare HMO

## 2020-12-31 DIAGNOSIS — R4182 Altered mental status, unspecified: Secondary | ICD-10-CM | POA: Diagnosis not present

## 2020-12-31 DIAGNOSIS — I6340 Cerebral infarction due to embolism of unspecified cerebral artery: Secondary | ICD-10-CM | POA: Diagnosis not present

## 2020-12-31 LAB — RENAL FUNCTION PANEL
Albumin: 1.6 g/dL — ABNORMAL LOW (ref 3.5–5.0)
Albumin: 1.7 g/dL — ABNORMAL LOW (ref 3.5–5.0)
Anion gap: 5 (ref 5–15)
Anion gap: 7 (ref 5–15)
BUN: 46 mg/dL — ABNORMAL HIGH (ref 8–23)
BUN: 41 mg/dL — ABNORMAL HIGH (ref 8–23)
CO2: 28 mmol/L (ref 22–32)
CO2: 27 mmol/L (ref 22–32)
Calcium: 8.2 mg/dL — ABNORMAL LOW (ref 8.9–10.3)
Calcium: 8.5 mg/dL — ABNORMAL LOW (ref 8.9–10.3)
Chloride: 104 mmol/L (ref 98–111)
Chloride: 103 mmol/L (ref 98–111)
Creatinine, Ser: 1.14 mg/dL — ABNORMAL HIGH (ref 0.44–1.00)
Creatinine, Ser: 1.09 mg/dL — ABNORMAL HIGH (ref 0.44–1.00)
GFR, Estimated: 51 mL/min — ABNORMAL LOW (ref >60)
GFR, Estimated: 54 mL/min — ABNORMAL LOW (ref >60)
Glucose, Bld: 258 mg/dL — ABNORMAL HIGH (ref 70–99)
Glucose, Bld: 240 mg/dL — ABNORMAL HIGH (ref 70–99)
Phosphorus: 2.5 mg/dL (ref 2.5–4.6)
Phosphorus: 2.8 mg/dL (ref 2.5–4.6)
Potassium: 4.1 mmol/L (ref 3.5–5.1)
Potassium: 4.3 mmol/L (ref 3.5–5.1)
Sodium: 137 mmol/L (ref 135–145)
Sodium: 137 mmol/L (ref 135–145)

## 2020-12-31 LAB — GLUCOSE, CAPILLARY
Glucose-Capillary: 134 mg/dL — ABNORMAL HIGH (ref 70–99)
Glucose-Capillary: 214 mg/dL — ABNORMAL HIGH (ref 70–99)
Glucose-Capillary: 221 mg/dL — ABNORMAL HIGH (ref 70–99)
Glucose-Capillary: 237 mg/dL — ABNORMAL HIGH (ref 70–99)
Glucose-Capillary: 243 mg/dL — ABNORMAL HIGH (ref 70–99)
Glucose-Capillary: 255 mg/dL — ABNORMAL HIGH (ref 70–99)

## 2020-12-31 LAB — MAGNESIUM: Magnesium: 2.4 mg/dL (ref 1.7–2.4)
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
## 2020-12-31 NOTE — Progress Notes (Signed)
PT Cancellation Note  Patient Details Name: JUNEAU DOUGHMAN MRN: [id number] DOB: 03/05/1951   Cancelled Treatment:    Reason Eval/Treat Not Completed: Patient not medically ready Discussed with RN; pt minimally responsive on CRRT and EEG currently. Will follow.  Lillia Pauls, PT, DPT Acute Rehabilitation Services Pager [phone] Office [phone]    Norval Morton 12/31/2020, 9:25 AM
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
## 2020-12-31 NOTE — Progress Notes (Signed)
EEG complete - results pending 
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
## 2020-12-31 NOTE — Progress Notes (Signed)
While initiating a bath on this patient the nurse tech found the cap to the patients central line behind her shoulder. On assessment the line had broken and was open with no way to clamp the line. At this time a hemostat was placed to the broken line.   Trauma notified and after it was assessed by Dr. Hayden Rasmussen, a verbal order was taken to remove the central line.   Vitals stable at this time.

## 2020-12-31 NOTE — Progress Notes (Signed)
OT Cancellation Note  Patient Details Name: ILEA HILTON MRN: [id number] DOB: 1950-12-29   Cancelled Treatment:    Reason Eval/Treat Not Completed: Patient not medically ready (CRRT / RN requesting to hold)  Wynona Neat, OTR/L  Acute Rehabilitation Services Pager: [phone] Office: [phone] .  12/31/2020, 9:40 AM

## 2021-01-01 ENCOUNTER — Inpatient Hospital Stay (HOSPITAL_COMMUNITY): Payer: Medicare HMO

## 2021-01-01 DIAGNOSIS — Z515 Encounter for palliative care: Secondary | ICD-10-CM

## 2021-01-01 DIAGNOSIS — I6340 Cerebral infarction due to embolism of unspecified cerebral artery: Secondary | ICD-10-CM | POA: Diagnosis not present

## 2021-01-01 DIAGNOSIS — Z7189 Other specified counseling: Secondary | ICD-10-CM

## 2021-01-01 LAB — RENAL FUNCTION PANEL
Albumin: 1.8 g/dL — ABNORMAL LOW (ref 3.5–5.0)
Albumin: 1.7 g/dL — ABNORMAL LOW (ref 3.5–5.0)
Anion gap: 10 (ref 5–15)
Anion gap: 10 (ref 5–15)
BUN: 40 mg/dL — ABNORMAL HIGH (ref 8–23)
BUN: 64 mg/dL — ABNORMAL HIGH (ref 8–23)
CO2: 26 mmol/L (ref 22–32)
CO2: 24 mmol/L (ref 22–32)
Calcium: 9 mg/dL (ref 8.9–10.3)
Calcium: 8.8 mg/dL — ABNORMAL LOW (ref 8.9–10.3)
Chloride: 101 mmol/L (ref 98–111)
Chloride: 103 mmol/L (ref 98–111)
Creatinine, Ser: 1.16 mg/dL — ABNORMAL HIGH (ref 0.44–1.00)
Creatinine, Ser: 1.63 mg/dL — ABNORMAL HIGH (ref 0.44–1.00)
GFR, Estimated: 50 mL/min — ABNORMAL LOW (ref >60)
GFR, Estimated: 33 mL/min — ABNORMAL LOW (ref >60)
Glucose, Bld: 238 mg/dL — ABNORMAL HIGH (ref 70–99)
Glucose, Bld: 280 mg/dL — ABNORMAL HIGH (ref 70–99)
Phosphorus: 2.9 mg/dL (ref 2.5–4.6)
Phosphorus: 3.7 mg/dL (ref 2.5–4.6)
Potassium: 4.9 mmol/L (ref 3.5–5.1)
Potassium: 5 mmol/L (ref 3.5–5.1)
Sodium: 137 mmol/L (ref 135–145)
Sodium: 137 mmol/L (ref 135–145)

## 2021-01-01 LAB — CBC
HCT: 23.7 % — ABNORMAL LOW (ref 36.0–46.0)
Hemoglobin: 7.7 g/dL — ABNORMAL LOW (ref 12.0–15.0)
MCH: 30.3 pg (ref 26.0–34.0)
MCHC: 32.5 g/dL (ref 30.0–36.0)
MCV: 93.3 fL (ref 80.0–100.0)
Platelets: 177 10*3/uL (ref 150–400)
RBC: 2.54 MIL/uL — ABNORMAL LOW (ref 3.87–5.11)
RDW: 15.9 % — ABNORMAL HIGH (ref 11.5–15.5)
WBC: 7 10*3/uL (ref 4.0–10.5)
nRBC: 0 % (ref 0.0–0.2)

## 2021-01-01 LAB — GLUCOSE, CAPILLARY
Glucose-Capillary: 289 mg/dL — ABNORMAL HIGH (ref 70–99)
Glucose-Capillary: 189 mg/dL — ABNORMAL HIGH (ref 70–99)
Glucose-Capillary: 242 mg/dL — ABNORMAL HIGH (ref 70–99)
Glucose-Capillary: 252 mg/dL — ABNORMAL HIGH (ref 70–99)
Glucose-Capillary: 267 mg/dL — ABNORMAL HIGH (ref 70–99)
Glucose-Capillary: 283 mg/dL — ABNORMAL HIGH (ref 70–99)

## 2021-01-01 LAB — IRON AND TIBC
Iron: 51 ug/dL (ref 28–170)
Saturation Ratios: 17 % (ref 10.4–31.8)
TIBC: 301 ug/dL (ref 250–450)
UIBC: 250 ug/dL

## 2021-01-01 LAB — MAGNESIUM: Magnesium: 2.6 mg/dL — ABNORMAL HIGH (ref 1.7–2.4)

## 2021-01-01 LAB — FERRITIN: Ferritin: 848 ng/mL — ABNORMAL HIGH (ref 11–307)

## 2021-01-01 MED ORDER — INSULIN DETEMIR 100 UNIT/ML ~~LOC~~ SOLN
5.0000 [IU] | Freq: Two times a day (BID) | SUBCUTANEOUS | Status: DC
  Administered 2021-01-01 – 2021-01-03 (×4): 5 [IU] via SUBCUTANEOUS
  Filled 2021-01-01 (×5): qty 0.05
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
## 2021-01-01 NOTE — Progress Notes (Signed)
OT Cancellation Note  Patient Details Name: FLOR WHITACRE MRN: [id number] DOB: 04/01/1951   Cancelled Treatment:    Reason Eval/Treat Not Completed: Patient not medically ready (CRRT clotting and RN working on clarification for therapy to start will hold pending updates)  Wynona Neat, OTR/L  Acute Rehabilitation Services Pager: [phone] Office: [phone] .  01/01/2021, 9:09 AM
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
## 2021-01-01 NOTE — Progress Notes (Addendum)
[facility] Resident Progress Note   Subjective:   Patient remains mechanically intubated; she is intermittently tracking activity with eyes; however, still not following commands. There is no significant change in mental status at this time. For MRI  Objective Vitals:   01/01/21 0900 01/01/21 1000 01/01/21 1056 01/01/21 1100  BP: 116/61 113/64  (!) 90/51  Pulse: 92 95  97  Resp: (!) 24 (!) 25  17  Temp: 97.88 F (36.6 C) 98.96 F (37.2 C)  (!) 100.76 F (38.2 C)  TempSrc:      SpO2: 100% 100% 100% 100%  Weight:      Height:       Physical Exam General: acutely ill appearing elderly female; remains intubated; eyes open and intermittently tracking Heart: RRR, S1 and S2 present  Lungs: coarse breath sounds bilaterally; tolerating PSV FiO2 30% Abdomen: soft, nondistended; wound vac in place Extremities: anasarca significantly improved Dialysis Access: RIJ temp HD catheter-  placed on 7/16  Additional Objective Labs: Basic Metabolic Panel: Recent Labs  Lab 12/31/20 0500 12/31/20 1601 01/01/21 0509  NA 137 137 137  K 4.1 4.3 4.9  CL 104 103 101  CO2 '28 27 26  ' GLUCOSE 258* 240* 238*  BUN 46* 41* 40*  CREATININE 1.14* 1.09* 1.16*  CALCIUM 8.2* 8.5* 9.0  PHOS 2.5 2.8 2.9   Liver Function Tests: Recent Labs  Lab 12/31/20 0500 12/31/20 1601 01/01/21 0509  ALBUMIN 1.6* 1.7* 1.8*   No results for input(s): LIPASE, AMYLASE in the last 168 hours. CBC: Recent Labs  Lab 12/27/20 0446 12/28/20 0450 12/28/20 1823 12/29/20 0538 12/30/20 0525 01/01/21 0509  WBC 9.0 7.4  --  6.7 5.8 7.0  HGB 7.0* 6.3*   < > 8.5* 8.2* 7.7*  HCT 21.0* 19.3*   < > 25.4* 24.2* 23.7*  MCV 90.5 91.9  --  90.1 91.3 93.3  PLT 111* 102*  --  115* 119* 177   < > = values in this interval not displayed.   Blood Culture    Component Value Date/Time   SDES URINE, CATHETERIZED 12/18/2020 0037   SPECREQUEST NONE 12/18/2020 0037   CULT  12/18/2020 0037    NO GROWTH Performed  at [facility], [place] [address] [place], [place] [address]    REPTSTATUS 12/15/2020 FINAL 12/18/2020 0037    Cardiac Enzymes: No results for input(s): CKTOTAL, CKMB, CKMBINDEX, TROPONINI in the last 168 hours. CBG: Recent Labs  Lab 12/31/20 1940 12/31/20 2318 01/01/21 0331 01/01/21 0754 01/01/21 1132  GLUCAP 243* 255* 289* 189* 242*    Medications:   prismasol BGK 4/2.5 300 mL/hr at 01/01/21 0219    prismasol BGK 4/2.5 200 mL/hr at 12/31/20 1259   sodium chloride     sodium chloride Stopped (12/25/2020 0806)   sodium chloride     feeding supplement (PIVOT 1.5 CAL) 1,000 mL (12/31/20 0414)   lactated ringers Stopped (12/27/20 0050)   prismasol BGK 4/2.5 1,500 mL/hr at 01/01/21 0713    sodium chloride   Intravenous Once   acetaminophen  650 mg Per Tube Q6H   atorvastatin  40 mg Per Tube Daily   chlorhexidine gluconate (MEDLINE KIT)  15 mL Mouth Rinse BID   Chlorhexidine Gluconate Cloth  6 each Topical Daily   docusate  100 mg Per Tube BID   feeding supplement (PROSource TF)  90 mL Per Tube TID   furosemide  40 mg Intravenous Daily   Gerhardt's butt cream   Topical TID  heparin injection (subcutaneous)  5,000 Units Subcutaneous Q8H   insulin aspart  0-15 Units Subcutaneous Q4H   mouth rinse  15 mL Mouth Rinse 10 times per day   methocarbamol  1,000 mg Per Tube Q8H   pantoprazole sodium  40 mg Per Tube Daily   polyethylene glycol  17 g Per Tube Daily   sodium chloride flush  10-40 mL Intracatheter Q12H    Dialysis Orders:  Assessment/Plan: Ms Gift Rueckert is a 70 year old female 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  Severe nonoligouric AKI: baseline sCr 1.3 that has been progressively worsening likely multifactorial in setting of ATN due to shock and contrast nephropathy. Started on CRRT  7/16. Has had 4.7L off with CRRT over past 24 hrs. No urine output over past 24 hours. Net -3.3L over past 24 hours; she appears more euvolemic on exam this morning. BUN/Cr remains slightly elevated; however, will plan for CRRT holiday to allow time to evaluate recovery of renal function.   Encephalopathy: multifactorial in setting of multiple embolic strokes and uremia. Patient is intermittently tracking with eyes but does not follow commands on my exam; Neurology reconsulted by primary team for neuroprognostication. EEG with ongoing diffuse encephalopathy without evidence of seizure-like activity. MRI Brain pending. CRRT holiday as above.  Ventilator dependent respiratory failure: on minimal vent settings; continues to wean; may consider trach if not able to extubate and if mental status continues to preclude extubation. Would benefit from [place] discussion with family.  Anemia: Hb stable this AM. 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 Wound vac in place with minimal serous output. Management per primary team.  Thrombocytopenia: improved; continue to monitor CBC  Harvie Heck, MD Internal Medicine, PGY-3 01/01/21 11:42 AM Pager # [phone]  Patient seen and examined, agree with above note with above modifications. Now since has been on CRRT for 4 days is much better volume wise and BUN down to 40.  Unfortunately her MS has not improved with this.  Neuro is involved and plan is for another MRI to help assist with neuro prognosis for this nice lady.  Her husband and son are at bedside and seem to understand the issues.   Renal wise, since needing to stop CRRT for MRI will go ahead and give her a CRRT holiday to see what she can do.  Re initiation of any RRT will be decided on a day to day basis  Corliss Parish, MD 01/01/2021
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
## 2021-01-02 DIAGNOSIS — Z515 Encounter for palliative care: Secondary | ICD-10-CM | POA: Diagnosis not present

## 2021-01-02 DIAGNOSIS — J9600 Acute respiratory failure, unspecified whether with hypoxia or hypercapnia: Secondary | ICD-10-CM

## 2021-01-02 DIAGNOSIS — I6340 Cerebral infarction due to embolism of unspecified cerebral artery: Secondary | ICD-10-CM | POA: Diagnosis not present

## 2021-01-02 DIAGNOSIS — Z7189 Other specified counseling: Secondary | ICD-10-CM | POA: Diagnosis not present

## 2021-01-02 DIAGNOSIS — Z66 Do not resuscitate: Secondary | ICD-10-CM

## 2021-01-02 LAB — CBC
HCT: 21.3 % — ABNORMAL LOW (ref 36.0–46.0)
Hemoglobin: 7 g/dL — ABNORMAL LOW (ref 12.0–15.0)
MCH: 30.7 pg (ref 26.0–34.0)
MCHC: 32.9 g/dL (ref 30.0–36.0)
MCV: 93.4 fL (ref 80.0–100.0)
Platelets: 174 10*3/uL (ref 150–400)
RBC: 2.28 MIL/uL — ABNORMAL LOW (ref 3.87–5.11)
RDW: 15.8 % — ABNORMAL HIGH (ref 11.5–15.5)
WBC: 8.8 10*3/uL (ref 4.0–10.5)
nRBC: 0 % (ref 0.0–0.2)

## 2021-01-02 LAB — MAGNESIUM: Magnesium: 2.6 mg/dL — ABNORMAL HIGH (ref 1.7–2.4)

## 2021-01-02 LAB — RENAL FUNCTION PANEL
Albumin: 1.8 g/dL — ABNORMAL LOW (ref 3.5–5.0)
Albumin: 1.7 g/dL — ABNORMAL LOW (ref 3.5–5.0)
Anion gap: 11 (ref 5–15)
Anion gap: 10 (ref 5–15)
BUN: 90 mg/dL — ABNORMAL HIGH (ref 8–23)
BUN: 105 mg/dL — ABNORMAL HIGH (ref 8–23)
CO2: 23 mmol/L (ref 22–32)
CO2: 21 mmol/L — ABNORMAL LOW (ref 22–32)
Calcium: 9.4 mg/dL (ref 8.9–10.3)
Calcium: 9.3 mg/dL (ref 8.9–10.3)
Chloride: 104 mmol/L (ref 98–111)
Chloride: 106 mmol/L (ref 98–111)
Creatinine, Ser: 2.31 mg/dL — ABNORMAL HIGH (ref 0.44–1.00)
Creatinine, Ser: 2.59 mg/dL — ABNORMAL HIGH (ref 0.44–1.00)
GFR, Estimated: 22 mL/min — ABNORMAL LOW (ref >60)
GFR, Estimated: 19 mL/min — ABNORMAL LOW (ref >60)
Glucose, Bld: 158 mg/dL — ABNORMAL HIGH (ref 70–99)
Glucose, Bld: 225 mg/dL — ABNORMAL HIGH (ref 70–99)
Phosphorus: 4.6 mg/dL (ref 2.5–4.6)
Phosphorus: 6.7 mg/dL — ABNORMAL HIGH (ref 2.5–4.6)
Potassium: 4.9 mmol/L (ref 3.5–5.1)
Potassium: 5.4 mmol/L — ABNORMAL HIGH (ref 3.5–5.1)
Sodium: 138 mmol/L (ref 135–145)
Sodium: 137 mmol/L (ref 135–145)

## 2021-01-02 LAB — GLUCOSE, CAPILLARY
Glucose-Capillary: 115 mg/dL — ABNORMAL HIGH (ref 70–99)
Glucose-Capillary: 135 mg/dL — ABNORMAL HIGH (ref 70–99)
Glucose-Capillary: 188 mg/dL — ABNORMAL HIGH (ref 70–99)
Glucose-Capillary: 221 mg/dL — ABNORMAL HIGH (ref 70–99)
Glucose-Capillary: 245 mg/dL — ABNORMAL HIGH (ref 70–99)
Glucose-Capillary: 276 mg/dL — ABNORMAL HIGH (ref 70–99)

## 2021-01-02 MED ORDER — RACEPINEPHRINE HCL 2.25 % IN NEBU
INHALATION_SOLUTION | RESPIRATORY_TRACT | Status: AC
  Filled 2021-01-02: qty 0.5
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
## 2021-01-03 DIAGNOSIS — Z66 Do not resuscitate: Secondary | ICD-10-CM | POA: Diagnosis not present

## 2021-01-03 DIAGNOSIS — Z7189 Other specified counseling: Secondary | ICD-10-CM | POA: Diagnosis not present

## 2021-01-03 DIAGNOSIS — Z515 Encounter for palliative care: Secondary | ICD-10-CM | POA: Diagnosis not present

## 2021-01-03 DIAGNOSIS — I6340 Cerebral infarction due to embolism of unspecified cerebral artery: Secondary | ICD-10-CM | POA: Diagnosis not present

## 2021-01-03 LAB — MAGNESIUM: Magnesium: 2.5 mg/dL — ABNORMAL HIGH (ref 1.7–2.4)

## 2021-01-03 LAB — CBC
HCT: 19.6 % — ABNORMAL LOW (ref 36.0–46.0)
Hemoglobin: 6.4 g/dL — CL (ref 12.0–15.0)
MCH: 30.6 pg (ref 26.0–34.0)
MCHC: 32.7 g/dL (ref 30.0–36.0)
MCV: 93.8 fL (ref 80.0–100.0)
Platelets: 163 10*3/uL (ref 150–400)
RBC: 2.09 MIL/uL — ABNORMAL LOW (ref 3.87–5.11)
RDW: 15.7 % — ABNORMAL HIGH (ref 11.5–15.5)
WBC: 8.5 10*3/uL (ref 4.0–10.5)
nRBC: 0 % (ref 0.0–0.2)

## 2021-01-03 LAB — RENAL FUNCTION PANEL
Albumin: 1.7 g/dL — ABNORMAL LOW (ref 3.5–5.0)
Anion gap: 14 (ref 5–15)
BUN: 117 mg/dL — ABNORMAL HIGH (ref 8–23)
CO2: 22 mmol/L (ref 22–32)
Calcium: 9.2 mg/dL (ref 8.9–10.3)
Chloride: 103 mmol/L (ref 98–111)
Creatinine, Ser: 2.84 mg/dL — ABNORMAL HIGH (ref 0.44–1.00)
GFR, Estimated: 17 mL/min — ABNORMAL LOW (ref >60)
Glucose, Bld: 264 mg/dL — ABNORMAL HIGH (ref 70–99)
Phosphorus: 6.8 mg/dL — ABNORMAL HIGH (ref 2.5–4.6)
Potassium: 5.2 mmol/L — ABNORMAL HIGH (ref 3.5–5.1)
Sodium: 139 mmol/L (ref 135–145)

## 2021-01-03 LAB — GLUCOSE, CAPILLARY
Glucose-Capillary: 282 mg/dL — ABNORMAL HIGH (ref 70–99)
Glucose-Capillary: 250 mg/dL — ABNORMAL HIGH (ref 70–99)
Glucose-Capillary: 197 mg/dL — ABNORMAL HIGH (ref 70–99)
Glucose-Capillary: 266 mg/dL — ABNORMAL HIGH (ref 70–99)

## 2021-01-03 MED ORDER — HALOPERIDOL LACTATE 2 MG/ML PO CONC
0.5000 mg | ORAL | Status: DC | PRN
  Filled 2021-01-03: qty 0.3

## 2021-01-03 MED ORDER — FENTANYL 2500MCG IN NS 250ML (10MCG/ML) PREMIX INFUSION
0.0000 ug/h | INTRAVENOUS | Status: DC
  Administered 2021-01-03: 50 ug/h via INTRAVENOUS

## 2021-01-03 MED ORDER — ACETAMINOPHEN 650 MG RE SUPP: 650.0000 mg | Freq: Four times a day (QID) | RECTAL | Status: DC | PRN

## 2021-01-03 MED ORDER — GLYCOPYRROLATE 0.2 MG/ML IJ SOLN: 0.2000 mg | INTRAMUSCULAR | Status: DC | PRN

## 2021-01-03 MED ORDER — ONDANSETRON 4 MG PO TBDP: 4.0000 mg | ORAL_TABLET | Freq: Four times a day (QID) | ORAL | Status: DC | PRN

## 2021-01-03 MED ORDER — FENTANYL BOLUS VIA INFUSION
50.0000 ug | INTRAVENOUS | Status: DC | PRN
  Filled 2021-01-03: qty 50

## 2021-01-03 MED ORDER — FENTANYL CITRATE (PF) 100 MCG/2ML IJ SOLN: 50.0000 ug | Freq: Once | INTRAMUSCULAR | Status: DC

## 2021-01-03 MED ORDER — POLYVINYL ALCOHOL 1.4 % OP SOLN
1.0000 [drp] | Freq: Four times a day (QID) | OPHTHALMIC | Status: DC | PRN
  Filled 2021-01-03: qty 15

## 2021-01-03 MED ORDER — GLYCOPYRROLATE 0.2 MG/ML IJ SOLN
0.2000 mg | INTRAMUSCULAR | Status: DC | PRN
  Administered 2021-01-03: 0.2 mg via INTRAVENOUS
  Filled 2021-01-03: qty 1

## 2021-01-03 MED ORDER — SODIUM ZIRCONIUM CYCLOSILICATE 10 G PO PACK
10.0000 g | PACK | Freq: Every day | ORAL | Status: DC
  Administered 2021-01-03: 10 g
  Filled 2021-01-03: qty 1

## 2021-01-03 MED ORDER — HALOPERIDOL 0.5 MG PO TABS
0.5000 mg | ORAL_TABLET | ORAL | Status: DC | PRN
  Filled 2021-01-03: qty 1

## 2021-01-03 MED ORDER — BIOTENE DRY MOUTH MT LIQD: 15.0000 mL | OROMUCOSAL | Status: DC | PRN

## 2021-01-03 MED ORDER — ONDANSETRON HCL 4 MG/2ML IJ SOLN: 4.0000 mg | Freq: Four times a day (QID) | INTRAMUSCULAR | Status: DC | PRN

## 2021-01-03 MED ORDER — HALOPERIDOL LACTATE 5 MG/ML IJ SOLN: 0.5000 mg | INTRAMUSCULAR | Status: DC | PRN

## 2021-01-03 MED ORDER — GLYCOPYRROLATE 1 MG PO TABS
1.0000 mg | ORAL_TABLET | ORAL | Status: DC | PRN
  Filled 2021-01-03: qty 1

## 2021-01-03 MED ORDER — ACETAMINOPHEN 325 MG PO TABS: 650.0000 mg | ORAL_TABLET | Freq: Four times a day (QID) | ORAL | Status: DC | PRN
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
## 2021-01-13 NOTE — Progress Notes (Signed)
Terminal extubation preformed per Dr Bedelia Person order.
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
## 2021-01-13 NOTE — Progress Notes (Signed)
PT Cancellation Note  Patient Details Name: Theresa Hunter MRN: [id number] DOB: 1951/05/20   Cancelled Treatment:    Reason Eval/Treat Not Completed: Other (comment). Per chart and RN, family likely to decide to pursue comfort care for pt. Coordinated with RN to re-order PT if they decide otherwise and pt improvements are noted. PT will sign off at this time.   Raymond Gurney, PT, DPT Acute Rehabilitation Services  Pager: [phone] Office: [phone]    Jewel Baize 01/02/2021, 8:35 AM
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
## 2021-01-13 NOTE — Progress Notes (Signed)
Patient cardiac time of death 54 and declared by this RN and Amy S, RN. [facility] notified. Pt's son at the bedside along with RN. Emotional support given. No belongings at the bedside. Tarin Johndrow, [place] Scrape, RN

## 2021-01-13 NOTE — Progress Notes (Signed)
 IV Fentanyl wasted with Francella Solian, RN. Lorrie Gargan, [place] Scrape, RN

## 2021-01-13 DEATH — deceased
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
## 2022-09-03 IMAGING — CR DG ABD PORTABLE 1V
1 series · 1 of 1 positions shown · non-contrast
Comparison: None.

CLINICAL DATA: NG tube placement.

EXAM:
PORTABLE ABDOMEN - 1 VIEW

[AP]
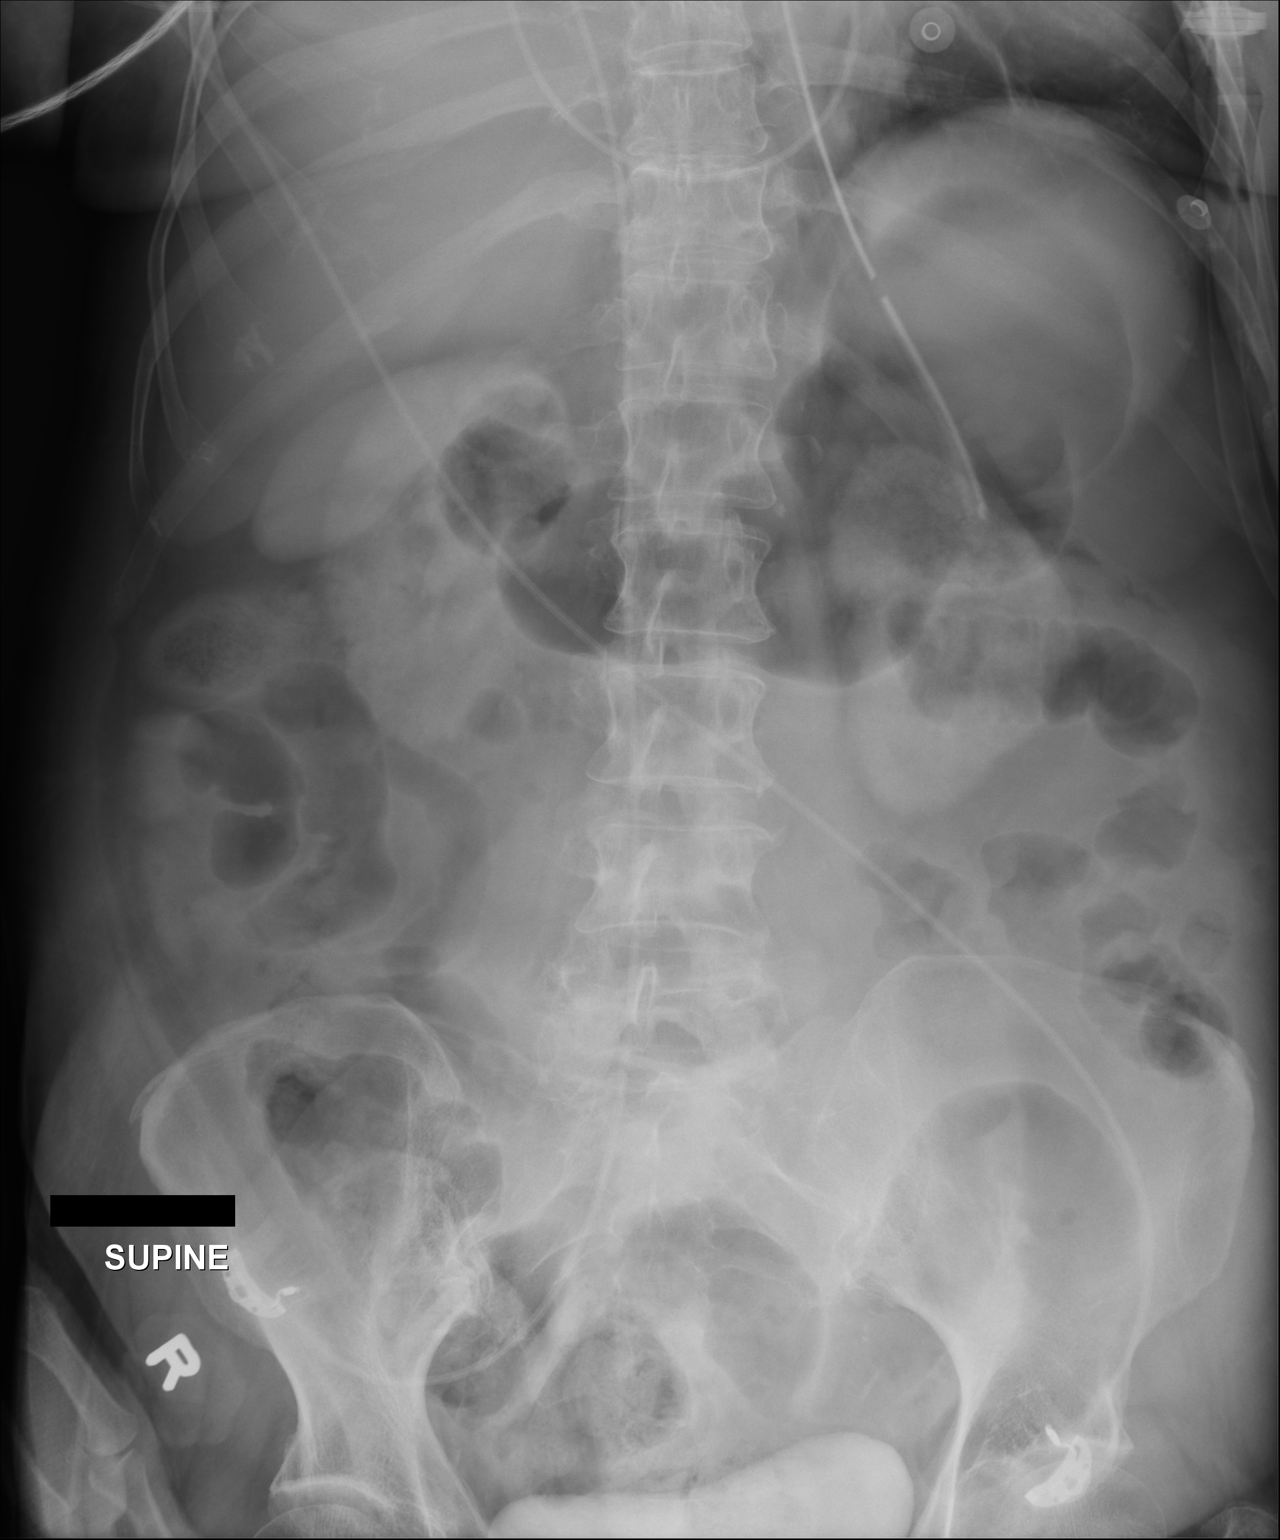

[1 of 1 positions shown; findings below may reference images not displayed]

FINDINGS: 6887 hours. NG tube tip is in the mid stomach with proximal side
port just below the GE junction. Moderate gaseous distention of the
stomach evident. Mild diffuse gaseous small bowel distention
evident.
IMPRESSION: NG tube tip is in the mid stomach with proximal side port just below
the GE junction.
